# Patient Record
Sex: Female | Born: 1955 | Race: White | Hispanic: No | Smoking: Former smoker
Health system: Southern US, Community
[De-identification: ages and names within clinical notes are randomized; demographics above are authoritative.]

## PROBLEM LIST (undated history)

## (undated) DIAGNOSIS — I10 Essential (primary) hypertension: Secondary | ICD-10-CM

## (undated) DIAGNOSIS — F319 Bipolar disorder, unspecified: Secondary | ICD-10-CM

## (undated) DIAGNOSIS — E079 Disorder of thyroid, unspecified: Secondary | ICD-10-CM

## (undated) DIAGNOSIS — J449 Chronic obstructive pulmonary disease, unspecified: Secondary | ICD-10-CM

## (undated) DIAGNOSIS — I509 Heart failure, unspecified: Secondary | ICD-10-CM

## (undated) DIAGNOSIS — F200 Paranoid schizophrenia: Secondary | ICD-10-CM

## (undated) HISTORY — PX: TOE AMPUTATION: SHX809

## (undated) HISTORY — PX: SHOULDER SURGERY: SHX246

## (undated) HISTORY — PX: ABDOMINAL HYSTERECTOMY: SHX81

---

## 2009-12-15 ENCOUNTER — Emergency Department (HOSPITAL_BASED_OUTPATIENT_CLINIC_OR_DEPARTMENT_OTHER): Admission: EM | Admit: 2009-12-15 | Discharge: 2009-12-15 | Payer: Self-pay | Admitting: Emergency Medicine

## 2009-12-15 ENCOUNTER — Ambulatory Visit: Payer: Self-pay | Admitting: Radiology

## 2009-12-16 ENCOUNTER — Emergency Department (HOSPITAL_BASED_OUTPATIENT_CLINIC_OR_DEPARTMENT_OTHER): Admission: EM | Admit: 2009-12-16 | Discharge: 2009-12-16 | Payer: Self-pay | Admitting: Emergency Medicine

## 2011-01-02 IMAGING — CR DG CHEST 2V
2 series · 2 of 2 positions shown · non-contrast
Comparison: None available.

CLINICAL DATA: Of breath.  COPD.  Cough and fever.

CHEST - 2 VIEW

[w chest pa]
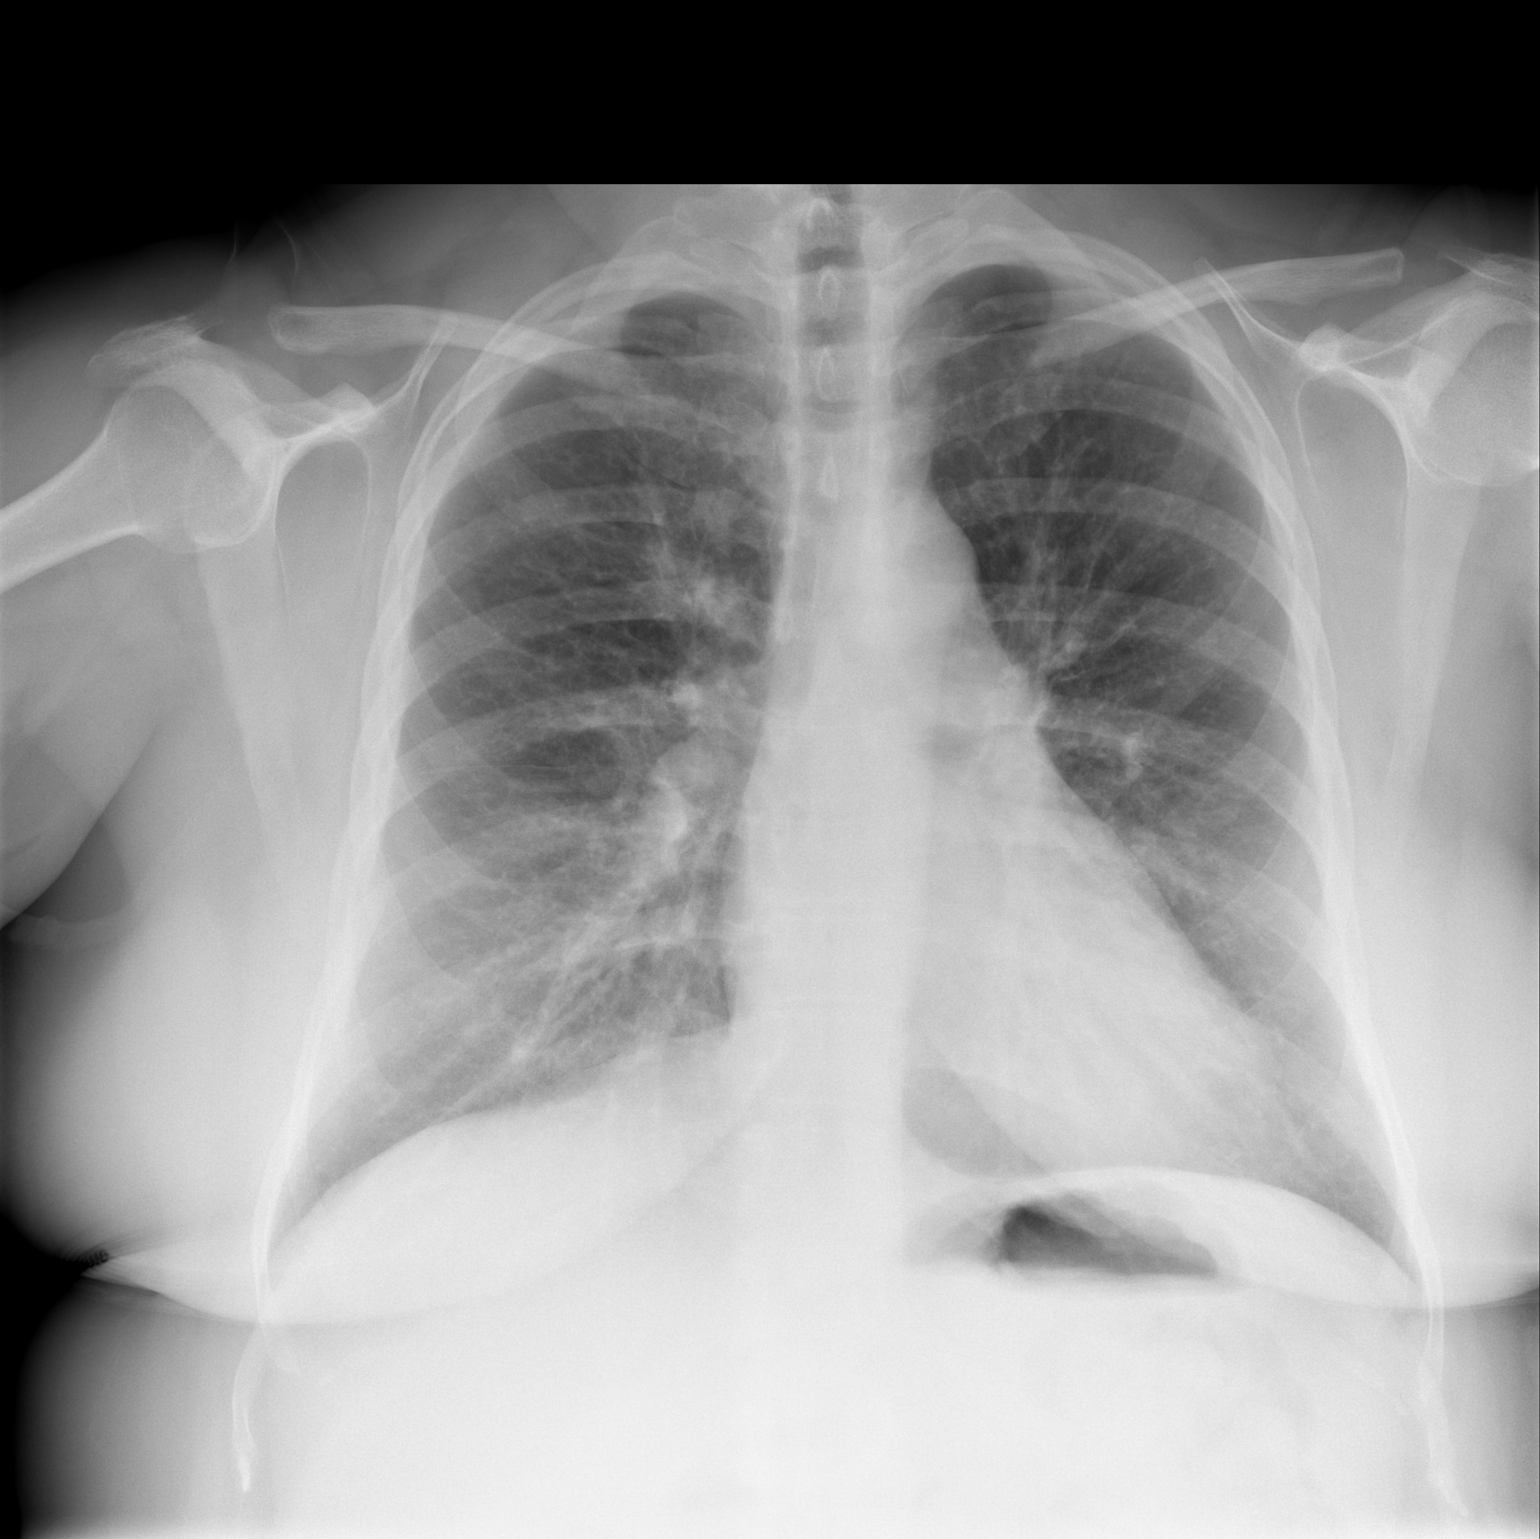

[w chest lat]
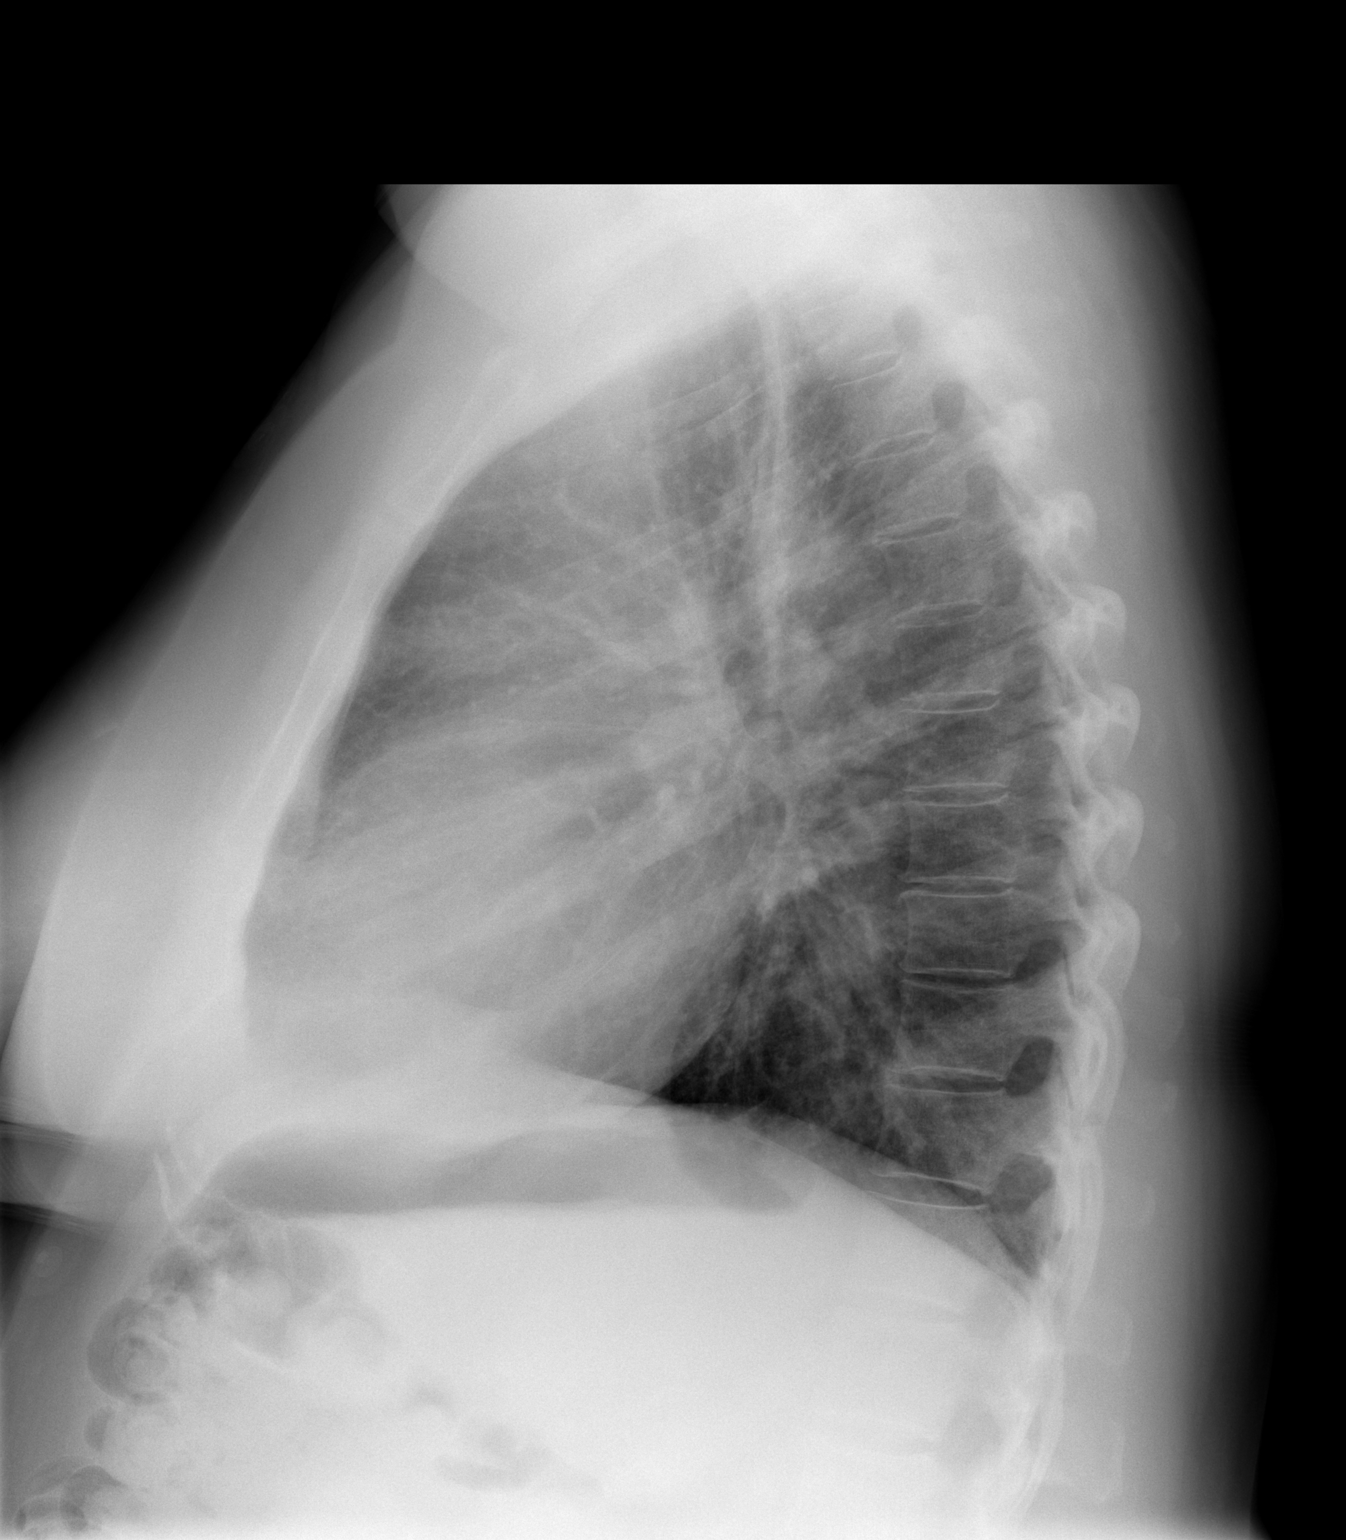

[2 of 2 positions shown; findings below may reference images not displayed]

FINDINGS: Cardiopericardial silhouette is at the upper limits of
normal size.  Ill-defined airspace disease is present at the medial
right lower lobe.  The left lung is clear.  Mild bronchitic changes
are noted bilaterally.  The visualized soft tissues and bony thorax
are unremarkable.
IMPRESSION: 1.  Mild bronchitic changes bilaterally.  This can be seen in the
setting of an acute viral process.
2.  Ill-defined airspace disease of the right lower lobe.  While
this may represent atelectasis, early bronchopneumonia is not
excluded.

## 2011-01-30 LAB — CBC
HCT: 39.3 % (ref 36.0–46.0)
Hemoglobin: 13.4 g/dL (ref 12.0–15.0)
Platelets: 185 10*3/uL (ref 150–400)
RBC: 4.3 MIL/uL (ref 3.87–5.11)
RDW: 14.5 % (ref 11.5–15.5)
WBC: 9.8 10*3/uL (ref 4.0–10.5)

## 2011-01-30 LAB — BASIC METABOLIC PANEL
CO2: 32 mEq/L (ref 19–32)
Calcium: 8.2 mg/dL — ABNORMAL LOW (ref 8.4–10.5)
Chloride: 105 mEq/L (ref 96–112)
Creatinine, Ser: 0.7 mg/dL (ref 0.4–1.2)

## 2011-01-30 LAB — DIFFERENTIAL
Eosinophils Absolute: 0 10*3/uL (ref 0.0–0.7)
Eosinophils Relative: 0 % (ref 0–5)
Monocytes Absolute: 0.7 10*3/uL (ref 0.1–1.0)
Monocytes Relative: 7 % (ref 3–12)
Neutro Abs: 7 10*3/uL (ref 1.7–7.7)
Neutrophils Relative %: 72 % (ref 43–77)

## 2014-05-01 ENCOUNTER — Encounter (HOSPITAL_BASED_OUTPATIENT_CLINIC_OR_DEPARTMENT_OTHER): Payer: Self-pay | Admitting: Emergency Medicine

## 2014-05-01 ENCOUNTER — Emergency Department (HOSPITAL_BASED_OUTPATIENT_CLINIC_OR_DEPARTMENT_OTHER): Payer: Medicare Other

## 2014-05-01 ENCOUNTER — Inpatient Hospital Stay (HOSPITAL_BASED_OUTPATIENT_CLINIC_OR_DEPARTMENT_OTHER)
Admission: EM | Admit: 2014-05-01 | Discharge: 2014-05-03 | DRG: 190 | Disposition: A | Discharge status: Home / Self Care | Payer: Medicare Other | Attending: Internal Medicine | Admitting: Internal Medicine

## 2014-05-01 DIAGNOSIS — Z885 Allergy status to narcotic agent status: Secondary | ICD-10-CM

## 2014-05-01 DIAGNOSIS — E039 Hypothyroidism, unspecified: Secondary | ICD-10-CM | POA: Diagnosis present

## 2014-05-01 DIAGNOSIS — M542 Cervicalgia: Secondary | ICD-10-CM | POA: Diagnosis present

## 2014-05-01 DIAGNOSIS — K219 Gastro-esophageal reflux disease without esophagitis: Secondary | ICD-10-CM | POA: Diagnosis present

## 2014-05-01 DIAGNOSIS — J9601 Acute respiratory failure with hypoxia: Secondary | ICD-10-CM

## 2014-05-01 DIAGNOSIS — F319 Bipolar disorder, unspecified: Secondary | ICD-10-CM | POA: Diagnosis present

## 2014-05-01 DIAGNOSIS — F411 Generalized anxiety disorder: Secondary | ICD-10-CM | POA: Diagnosis present

## 2014-05-01 DIAGNOSIS — J9621 Acute and chronic respiratory failure with hypoxia: Secondary | ICD-10-CM

## 2014-05-01 DIAGNOSIS — I5030 Unspecified diastolic (congestive) heart failure: Secondary | ICD-10-CM | POA: Diagnosis present

## 2014-05-01 DIAGNOSIS — R079 Chest pain, unspecified: Secondary | ICD-10-CM | POA: Diagnosis present

## 2014-05-01 DIAGNOSIS — G894 Chronic pain syndrome: Secondary | ICD-10-CM | POA: Diagnosis present

## 2014-05-01 DIAGNOSIS — F200 Paranoid schizophrenia: Secondary | ICD-10-CM | POA: Diagnosis present

## 2014-05-01 DIAGNOSIS — Z91041 Radiographic dye allergy status: Secondary | ICD-10-CM

## 2014-05-01 DIAGNOSIS — J441 Chronic obstructive pulmonary disease with (acute) exacerbation: Principal | ICD-10-CM | POA: Diagnosis present

## 2014-05-01 DIAGNOSIS — Z9981 Dependence on supplemental oxygen: Secondary | ICD-10-CM

## 2014-05-01 DIAGNOSIS — I509 Heart failure, unspecified: Secondary | ICD-10-CM | POA: Diagnosis present

## 2014-05-01 DIAGNOSIS — I10 Essential (primary) hypertension: Secondary | ICD-10-CM | POA: Diagnosis present

## 2014-05-01 DIAGNOSIS — R0689 Other abnormalities of breathing: Secondary | ICD-10-CM

## 2014-05-01 DIAGNOSIS — F17200 Nicotine dependence, unspecified, uncomplicated: Secondary | ICD-10-CM | POA: Diagnosis present

## 2014-05-01 DIAGNOSIS — J9620 Acute and chronic respiratory failure, unspecified whether with hypoxia or hypercapnia: Secondary | ICD-10-CM | POA: Diagnosis present

## 2014-05-01 HISTORY — DX: Heart failure, unspecified: I50.9

## 2014-05-01 HISTORY — DX: Paranoid schizophrenia: F20.0

## 2014-05-01 HISTORY — DX: Chronic obstructive pulmonary disease, unspecified: J44.9

## 2014-05-01 HISTORY — DX: Disorder of thyroid, unspecified: E07.9

## 2014-05-01 HISTORY — DX: Essential (primary) hypertension: I10

## 2014-05-01 HISTORY — DX: Bipolar disorder, unspecified: F31.9

## 2014-05-01 LAB — CBC WITH DIFFERENTIAL/PLATELET
BASOS ABS: 0 10*3/uL (ref 0.0–0.1)
BASOS PCT: 0 % (ref 0–1)
EOS ABS: 0.2 10*3/uL (ref 0.0–0.7)
Eosinophils Relative: 3 % (ref 0–5)
HCT: 45.9 % (ref 36.0–46.0)
HEMOGLOBIN: 14.4 g/dL (ref 12.0–15.0)
Lymphocytes Relative: 34 % (ref 12–46)
Lymphs Abs: 2.1 10*3/uL (ref 0.7–4.0)
MCH: 30.1 pg (ref 26.0–34.0)
MCHC: 31.4 g/dL (ref 30.0–36.0)
MCV: 96 fL (ref 78.0–100.0)
Monocytes Absolute: 0.6 10*3/uL (ref 0.1–1.0)
Monocytes Relative: 10 % (ref 3–12)
NEUTROS PCT: 54 % (ref 43–77)
Neutro Abs: 3.4 10*3/uL (ref 1.7–7.7)
PLATELETS: 203 10*3/uL (ref 150–400)
RBC: 4.78 MIL/uL (ref 3.87–5.11)
RDW: 15.1 % (ref 11.5–15.5)
WBC: 6.4 10*3/uL (ref 4.0–10.5)

## 2014-05-01 LAB — COMPREHENSIVE METABOLIC PANEL
ALT: 13 U/L (ref 0–35)
AST: 14 U/L (ref 0–37)
Albumin: 3.4 g/dL — ABNORMAL LOW (ref 3.5–5.2)
Alkaline Phosphatase: 87 U/L (ref 39–117)
BUN: 9 mg/dL (ref 6–23)
CO2: 31 mEq/L (ref 19–32)
CREATININE: 1 mg/dL (ref 0.50–1.10)
Calcium: 9.2 mg/dL (ref 8.4–10.5)
Chloride: 98 mEq/L (ref 96–112)
GFR, EST AFRICAN AMERICAN: 71 mL/min — AB (ref >90)
GFR, EST NON AFRICAN AMERICAN: 61 mL/min — AB (ref >90)
GLUCOSE: 127 mg/dL — AB (ref 70–99)
POTASSIUM: 3.5 meq/L — AB (ref 3.7–5.3)
SODIUM: 141 meq/L (ref 137–147)
Total Protein: 6.9 g/dL (ref 6.0–8.3)

## 2014-05-01 LAB — PRO B NATRIURETIC PEPTIDE: PRO B NATRI PEPTIDE: 20.5 pg/mL (ref 0–125)

## 2014-05-01 LAB — TROPONIN I: Troponin I: <0.3 ng/mL (ref <0.30)

## 2014-05-01 MED ORDER — ALBUTEROL SULFATE (2.5 MG/3ML) 0.083% IN NEBU
2.5000 mg | INHALATION_SOLUTION | Freq: Once | RESPIRATORY_TRACT | Status: AC
  Administered 2014-05-01: 2.5 mg via RESPIRATORY_TRACT

## 2014-05-01 MED ORDER — IPRATROPIUM BROMIDE 0.02 % IN SOLN
0.5000 mg | Freq: Once | RESPIRATORY_TRACT | Status: DC
  Filled 2014-05-01: qty 2.5

## 2014-05-01 MED ORDER — IPRATROPIUM-ALBUTEROL 0.5-2.5 (3) MG/3ML IN SOLN
3.0000 mL | RESPIRATORY_TRACT | Status: DC
  Administered 2014-05-01 – 2014-05-03 (×7): 3 mL via RESPIRATORY_TRACT
  Filled 2014-05-01 (×8): qty 3

## 2014-05-01 MED ORDER — ALBUTEROL SULFATE (2.5 MG/3ML) 0.083% IN NEBU
5.0000 mg | INHALATION_SOLUTION | Freq: Once | RESPIRATORY_TRACT | Status: DC
  Filled 2014-05-01: qty 6

## 2014-05-01 MED ORDER — ALBUTEROL (5 MG/ML) CONTINUOUS INHALATION SOLN
10.0000 mg/h | INHALATION_SOLUTION | Freq: Once | RESPIRATORY_TRACT | Status: AC
  Administered 2014-05-01: 10 mg/h via RESPIRATORY_TRACT
  Filled 2014-05-01: qty 20

## 2014-05-01 MED ORDER — METHYLPREDNISOLONE SODIUM SUCC 125 MG IJ SOLR
125.0000 mg | Freq: Once | INTRAMUSCULAR | Status: AC
  Administered 2014-05-02: 125 mg via INTRAVENOUS
  Filled 2014-05-01: qty 2

## 2014-05-01 NOTE — ED Provider Notes (Signed)
CSN: [id number]     Arrival date & time 05/01/14  2144 History   First MD Initiated Contact with Patient 05/01/14 2213     Chief Complaint  Patient presents with  . Shortness of Breath     (Consider location/radiation/quality/duration/timing/severity/associated sxs/prior Treatment) Patient is a 58 y.o. female presenting with shortness of breath. The history is provided by the patient and the nursing home. No language interpreter was used.  Shortness of Breath Severity:  Moderate Onset quality:  Gradual Associated symptoms: chest pain, cough, fever, neck pain and vomiting   Associated symptoms: no abdominal pain   Associated symptoms comment:  She started having SOB earlier this afternoon, later associated with chest pressure "like an elephant sitting on my chest". She reports an increased cough with post-tussive vomiting earlier today. She reports fever to 103 orally 3 days ago but none since. She has a history of COPD with continuous tobacco use, home oxygen dependence, CHF on 80 mg Lasix daily with a reported decreased efficacy in reducing peripheral edema. She also reports a "knot in her throat" that started today causing both painful swallowing and pain with movement of her neck. She also has a concern for redness of her lower extremities bilaterally with a recent stated history of cellulitis, and concern for recurrence given the previous fever.    Past Medical History  Diagnosis Date  . COPD (chronic obstructive pulmonary disease)   . CHF (congestive heart failure)   . Thyroid disease   . Hypertension   . Bipolar 1 disorder   . Paranoid schizophrenia    Past Surgical History  Procedure Laterality Date  . Abdominal hysterectomy    . Shoulder surgery     No family history on file. History  Substance Use Topics  . Smoking status: Current Every Day Smoker -- 1.50 packs/day    Types: Cigarettes  . Smokeless tobacco: Not on file  . Alcohol Use: No   OB History   Grav Para  Term Preterm Abortions TAB SAB Ect Mult Living                 Review of Systems  Constitutional: Positive for fever.       See HPI.  Respiratory: Positive for cough and shortness of breath.   Cardiovascular: Positive for chest pain and leg swelling.  Gastrointestinal: Positive for vomiting. Negative for nausea and abdominal pain.       See HPI - post-tussive vomiting only.  Musculoskeletal: Positive for neck pain.      Allergies  Ivp dye; Morphine and related; and Vioxx  Home Medications   Prior to Admission medications   Medication Sig Start Date End Date Taking? Authorizing Regis Hinton  ALBUTEROL IN Inhale into the lungs.   Yes Historical Merrissa Giacobbe, MD  ALPRAZolam Prudy Feeler(XANAX) 1 MG tablet Take 1 mg by mouth at bedtime as needed for anxiety.   Yes Historical Mikki Ziff, MD  ARIPiprazole (ABILIFY PO) Take by mouth.   Yes Historical Jakwan Sally, MD  Carisoprodol (SOMA PO) Take by mouth.   Yes Historical Eustacio Ellen, MD  Citalopram Hydrobromide (CELEXA PO) Take by mouth.   Yes Historical Zykera Abella, MD  Fluticasone-Salmeterol (ADVAIR DISKUS IN) Inhale into the lungs.   Yes Historical Wanita Derenzo, MD  Furosemide (LASIX PO) Take by mouth.   Yes Historical Korby Ratay, MD  Levothyroxine Sodium (SYNTHROID PO) Take by mouth.   Yes Historical Terrace Chiem, MD  LOSARTAN POTASSIUM PO Take by mouth.   Yes Historical Dalasia Predmore, MD  Omeprazole (PRILOSEC PO) Take by mouth.  Yes Historical Lamarco Gudiel, MD  OxyCODONE HCl (OXYCONTIN PO) Take by mouth.   Yes Historical Oma Alpert, MD  oxyCODONE-acetaminophen (PERCOCET) 10-325 MG per tablet Take 1 tablet by mouth every 4 (four) hours as needed for pain.   Yes Historical Kiyanna Biegler, MD  Potassium (POTASSIMIN PO) Take by mouth.   Yes Historical Karee Christopherson, MD  tiotropium (SPIRIVA) 18 MCG inhalation capsule Place 18 mcg into inhaler and inhale daily.   Yes Historical Kelleen Stolze, MD   BP 127/72  Pulse 92  Temp(Src) 98 F (36.7 C) (Oral)  Resp 20  Ht 5\' 7"  (1.702 m)  Wt 240 lb  (108.863 kg)  BMI 37.58 kg/m2  SpO2 88% Physical Exam  Constitutional: She is oriented to person, place, and time. She appears well-developed and well-nourished. No distress.  HENT:  Head: Normocephalic.  Mouth/Throat: Oropharynx is clear and moist.  Neck: Normal range of motion.  Neck held in right lateral position but she is able to perform full range of motion.  Pulmonary/Chest: Effort normal.  Marked inspiratory and expiratory wheezing and coarse rhonchi.   Abdominal: Soft. There is no tenderness.  Musculoskeletal: She exhibits edema.  Bilateral lower extremity edema with redness. No warm. Minimally tender. Large venous stasis blister left anterior lower extremity.  Lymphadenopathy:    She has cervical adenopathy.  Neurological: She is alert and oriented to person, place, and time.  Skin: Skin is warm and dry.  Psychiatric: She has a normal mood and affect.    ED Course  Procedures (including critical care time) Labs Review Labs Reviewed  CBC WITH DIFFERENTIAL  COMPREHENSIVE METABOLIC PANEL  TROPONIN I  PRO B NATRIURETIC PEPTIDE    Imaging Review Dg Chest 2 View  05/01/2014   CLINICAL DATA:  Cough with shortness of breath. Chest heaviness. Home oxygen.  EXAM: CHEST  2 VIEW  COMPARISON:  12/15/2009  FINDINGS: Borderline heart size without vascular congestion. Central peribronchial thickening with linear infiltration in the right lung base probably representing changes of bronchitis or airways disease. No focal consolidation or airspace disease noted.  IMPRESSION: Peribronchial thickening and linear streaky opacities suggesting bronchitis or airways disease. No focal consolidation.   Electronically Signed   By: Burman NievesWilliam  Stevens M.D.   On: 05/01/2014 22:48     EKG Interpretation   Date/Time:  Monday May 01 2014 22:19:12 EDT Ventricular Rate:  79 PR Interval:  148 QRS Duration: 94 QT Interval:  404 QTC Calculation: 463 R Axis:   -80 Text Interpretation:  Normal sinus  rhythm Left axis deviation Cannot rule  out Anterior infarct , age undetermined Abnormal ECG No significant change  since last tracing Confirmed by YAO  MD, DAVID ([id number]) on 05/01/2014  10:27:03 PM      MDM   Final diagnoses:  None    1. COPD Exacerbation  CXR showing no edema and BNP essentially negative. Does not appear to be CHF. Troponin and EKG without evidence for acute coronary event. Suspect with wheezing and hypoxia that current condition is COPD exacerbation. Steroids given. Multiple breathing nebulizers, including an hour long treatment. Patient will desaturate into the upper 80's while at rest. Discussed with [facility], Dr. Allena KatzPatel, who accepts [facility]. Abx started as recommended by admitting MD.     Arnoldo HookerShari A Upstill, PA-C 05/02/14 2035

## 2014-05-01 NOTE — ED Notes (Addendum)
Sob today. States she has developed a bad cough. Sputum is yellow. Uses home oxygen 2.5 l/m [place] at night. Oxygen sats noted. Pt is speaking in complete sentences and does not appear to be in resp distress. She states at home today her sats have been 87% all day.

## 2014-05-02 DIAGNOSIS — M79609 Pain in unspecified limb: Secondary | ICD-10-CM

## 2014-05-02 DIAGNOSIS — R0989 Other specified symptoms and signs involving the circulatory and respiratory systems: Secondary | ICD-10-CM

## 2014-05-02 DIAGNOSIS — E039 Hypothyroidism, unspecified: Secondary | ICD-10-CM | POA: Diagnosis present

## 2014-05-02 DIAGNOSIS — F411 Generalized anxiety disorder: Secondary | ICD-10-CM | POA: Diagnosis present

## 2014-05-02 DIAGNOSIS — I10 Essential (primary) hypertension: Secondary | ICD-10-CM

## 2014-05-02 DIAGNOSIS — R0689 Other abnormalities of breathing: Secondary | ICD-10-CM | POA: Diagnosis present

## 2014-05-02 DIAGNOSIS — J9600 Acute respiratory failure, unspecified whether with hypoxia or hypercapnia: Secondary | ICD-10-CM

## 2014-05-02 DIAGNOSIS — G894 Chronic pain syndrome: Secondary | ICD-10-CM

## 2014-05-02 DIAGNOSIS — R0609 Other forms of dyspnea: Secondary | ICD-10-CM

## 2014-05-02 DIAGNOSIS — Z9981 Dependence on supplemental oxygen: Secondary | ICD-10-CM

## 2014-05-02 DIAGNOSIS — J441 Chronic obstructive pulmonary disease with (acute) exacerbation: Secondary | ICD-10-CM | POA: Diagnosis present

## 2014-05-02 DIAGNOSIS — M7989 Other specified soft tissue disorders: Secondary | ICD-10-CM

## 2014-05-02 DIAGNOSIS — R072 Precordial pain: Secondary | ICD-10-CM

## 2014-05-02 DIAGNOSIS — I509 Heart failure, unspecified: Secondary | ICD-10-CM

## 2014-05-02 LAB — COMPREHENSIVE METABOLIC PANEL
ALT: 11 U/L (ref 0–35)
AST: 11 U/L (ref 0–37)
Albumin: 3.1 g/dL — ABNORMAL LOW (ref 3.5–5.2)
Alkaline Phosphatase: 86 U/L (ref 39–117)
BUN: 10 mg/dL (ref 6–23)
CO2: 30 mEq/L (ref 19–32)
Calcium: 8.7 mg/dL (ref 8.4–10.5)
Chloride: 97 mEq/L (ref 96–112)
Creatinine, Ser: 0.91 mg/dL (ref 0.50–1.10)
GFR calc Af Amer: 80 mL/min — ABNORMAL LOW (ref >90)
GFR calc non Af Amer: 69 mL/min — ABNORMAL LOW (ref >90)
Glucose, Bld: 210 mg/dL — ABNORMAL HIGH (ref 70–99)
Potassium: 3.4 mEq/L — ABNORMAL LOW (ref 3.7–5.3)
Sodium: 141 mEq/L (ref 137–147)
Total Bilirubin: <0.2 mg/dL — ABNORMAL LOW (ref 0.3–1.2)
Total Protein: 6.2 g/dL (ref 6.0–8.3)

## 2014-05-02 LAB — I-STAT ARTERIAL BLOOD GAS, ED
Acid-Base Excess: 6 mmol/L — ABNORMAL HIGH (ref 0.0–2.0)
BICARBONATE: 35.5 meq/L — AB (ref 20.0–24.0)
O2 SAT: 85 %
PCO2 ART: 69 mmHg — AB (ref 35.0–45.0)
PO2 ART: 55 mmHg — AB (ref 80.0–100.0)
Patient temperature: 98
TCO2: 38 mmol/L (ref 0–100)
pH, Arterial: 7.318 — ABNORMAL LOW (ref 7.350–7.450)

## 2014-05-02 LAB — GLUCOSE, CAPILLARY
Glucose-Capillary: 144 mg/dL — ABNORMAL HIGH (ref 70–99)
Glucose-Capillary: 161 mg/dL — ABNORMAL HIGH (ref 70–99)

## 2014-05-02 LAB — STREP PNEUMONIAE URINARY ANTIGEN: Strep Pneumo Urinary Antigen: NEGATIVE

## 2014-05-02 LAB — CBC
HCT: 42.9 % (ref 36.0–46.0)
HEMOGLOBIN: 13 g/dL (ref 12.0–15.0)
MCH: 29.4 pg (ref 26.0–34.0)
MCHC: 30.3 g/dL (ref 30.0–36.0)
MCV: 97.1 fL (ref 78.0–100.0)
PLATELETS: 189 10*3/uL (ref 150–400)
RBC: 4.42 MIL/uL (ref 3.87–5.11)
RDW: 15.3 % (ref 11.5–15.5)
WBC: 5.1 10*3/uL (ref 4.0–10.5)

## 2014-05-02 LAB — TROPONIN I
Troponin I: <0.3 ng/mL (ref <0.30)
Troponin I: <0.3 ng/mL (ref <0.30)

## 2014-05-02 LAB — PROTIME-INR
INR: 0.98 (ref 0.00–1.49)
Prothrombin Time: 12.8 seconds (ref 11.6–15.2)

## 2014-05-02 LAB — LEGIONELLA ANTIGEN, URINE: Legionella Antigen, Urine: NEGATIVE

## 2014-05-02 MED ORDER — ENOXAPARIN SODIUM 60 MG/0.6ML ~~LOC~~ SOLN
55.0000 mg | SUBCUTANEOUS | Status: DC
  Administered 2014-05-02 – 2014-05-03 (×2): 55 mg via SUBCUTANEOUS
  Filled 2014-05-02 (×3): qty 0.6

## 2014-05-02 MED ORDER — ACETAMINOPHEN 325 MG PO TABS
650.0000 mg | ORAL_TABLET | Freq: Four times a day (QID) | ORAL | Status: DC | PRN
  Administered 2014-05-02: 650 mg via ORAL
  Filled 2014-05-02: qty 2

## 2014-05-02 MED ORDER — DEXTROSE 5 % IV SOLN
500.0000 mg | Freq: Once | INTRAVENOUS | Status: AC
  Administered 2014-05-02: 500 mg via INTRAVENOUS
  Filled 2014-05-02: qty 500

## 2014-05-02 MED ORDER — FUROSEMIDE 40 MG PO TABS
40.0000 mg | ORAL_TABLET | Freq: Two times a day (BID) | ORAL | Status: DC
  Administered 2014-05-02 – 2014-05-03 (×3): 40 mg via ORAL
  Filled 2014-05-02 (×5): qty 1

## 2014-05-02 MED ORDER — OXYCODONE-ACETAMINOPHEN 5-325 MG PO TABS
1.0000 | ORAL_TABLET | Freq: Four times a day (QID) | ORAL | Status: DC | PRN
  Administered 2014-05-02 – 2014-05-03 (×2): 1 via ORAL
  Filled 2014-05-02 (×2): qty 1

## 2014-05-02 MED ORDER — BIOTENE DRY MOUTH MT LIQD
15.0000 mL | Freq: Two times a day (BID) | OROMUCOSAL | Status: DC
  Administered 2014-05-02 – 2014-05-03 (×3): 15 mL via OROMUCOSAL

## 2014-05-02 MED ORDER — CYCLOBENZAPRINE HCL 10 MG PO TABS
5.0000 mg | ORAL_TABLET | Freq: Three times a day (TID) | ORAL | Status: DC | PRN
  Administered 2014-05-02: 5 mg via ORAL
  Filled 2014-05-02: qty 1

## 2014-05-02 MED ORDER — GUAIFENESIN ER 600 MG PO TB12
600.0000 mg | ORAL_TABLET | Freq: Two times a day (BID) | ORAL | Status: DC
  Administered 2014-05-02 – 2014-05-03 (×3): 600 mg via ORAL
  Filled 2014-05-02 (×4): qty 1

## 2014-05-02 MED ORDER — PANTOPRAZOLE SODIUM 40 MG PO TBEC
40.0000 mg | DELAYED_RELEASE_TABLET | Freq: Every day | ORAL | Status: DC
  Administered 2014-05-02 – 2014-05-03 (×2): 40 mg via ORAL
  Filled 2014-05-02 (×2): qty 1

## 2014-05-02 MED ORDER — ONDANSETRON HCL 4 MG/2ML IJ SOLN: 4.0000 mg | Freq: Four times a day (QID) | INTRAMUSCULAR | Status: DC | PRN

## 2014-05-02 MED ORDER — POTASSIUM CHLORIDE CRYS ER 20 MEQ PO TBCR
40.0000 meq | EXTENDED_RELEASE_TABLET | Freq: Two times a day (BID) | ORAL | Status: DC
  Administered 2014-05-02 – 2014-05-03 (×3): 40 meq via ORAL
  Filled 2014-05-02 (×4): qty 2

## 2014-05-02 MED ORDER — INSULIN ASPART 100 UNIT/ML ~~LOC~~ SOLN
0.0000 [IU] | Freq: Three times a day (TID) | SUBCUTANEOUS | Status: DC
  Administered 2014-05-02 – 2014-05-03 (×3): 2 [IU] via SUBCUTANEOUS

## 2014-05-02 MED ORDER — LIDOCAINE 5 % EX PTCH
1.0000 | MEDICATED_PATCH | CUTANEOUS | Status: DC
  Administered 2014-05-02 – 2014-05-03 (×2): 1 via TRANSDERMAL
  Filled 2014-05-02 (×3): qty 1

## 2014-05-02 MED ORDER — DEXTROSE 5 % IV SOLN
500.0000 mg | INTRAVENOUS | Status: DC
  Administered 2014-05-03: 500 mg via INTRAVENOUS
  Filled 2014-05-02 (×3): qty 500

## 2014-05-02 MED ORDER — DEXTROSE 5 % IV SOLN
1.0000 g | INTRAVENOUS | Status: DC
  Administered 2014-05-03: 1 g via INTRAVENOUS
  Filled 2014-05-02 (×3): qty 10

## 2014-05-02 MED ORDER — METHYLPREDNISOLONE SODIUM SUCC 125 MG IJ SOLR
60.0000 mg | Freq: Two times a day (BID) | INTRAMUSCULAR | Status: DC
  Filled 2014-05-02: qty 0.96

## 2014-05-02 MED ORDER — METHYLPREDNISOLONE SODIUM SUCC 125 MG IJ SOLR
60.0000 mg | Freq: Four times a day (QID) | INTRAMUSCULAR | Status: DC
  Administered 2014-05-02 – 2014-05-03 (×5): 60 mg via INTRAVENOUS
  Filled 2014-05-02 (×9): qty 0.96

## 2014-05-02 MED ORDER — ALPRAZOLAM 0.5 MG PO TABS
1.0000 mg | ORAL_TABLET | Freq: Once | ORAL | Status: AC
  Administered 2014-05-02: 0.5 mg via ORAL
  Filled 2014-05-02: qty 2

## 2014-05-02 MED ORDER — CHLORHEXIDINE GLUCONATE 0.12 % MT SOLN
15.0000 mL | Freq: Two times a day (BID) | OROMUCOSAL | Status: DC
  Administered 2014-05-02 – 2014-05-03 (×3): 15 mL via OROMUCOSAL
  Filled 2014-05-02 (×5): qty 15

## 2014-05-02 MED ORDER — LEVOTHYROXINE SODIUM 150 MCG PO TABS
150.0000 ug | ORAL_TABLET | Freq: Every day | ORAL | Status: DC
  Administered 2014-05-02 – 2014-05-03 (×2): 150 ug via ORAL
  Filled 2014-05-02 (×3): qty 1

## 2014-05-02 MED ORDER — ACETAMINOPHEN 650 MG RE SUPP: 650.0000 mg | Freq: Four times a day (QID) | RECTAL | Status: DC | PRN

## 2014-05-02 MED ORDER — DEXTROSE 5 % IV SOLN
1.0000 g | Freq: Once | INTRAVENOUS | Status: AC
  Administered 2014-05-02: 1 g via INTRAVENOUS

## 2014-05-02 MED ORDER — IPRATROPIUM-ALBUTEROL 0.5-2.5 (3) MG/3ML IN SOLN
3.0000 mL | RESPIRATORY_TRACT | Status: DC
  Administered 2014-05-02: 3 mL via RESPIRATORY_TRACT

## 2014-05-02 MED ORDER — ONDANSETRON HCL 4 MG PO TABS: 4.0000 mg | ORAL_TABLET | Freq: Four times a day (QID) | ORAL | Status: DC | PRN

## 2014-05-02 MED ORDER — SODIUM CHLORIDE 0.9 % IJ SOLN
3.0000 mL | Freq: Two times a day (BID) | INTRAMUSCULAR | Status: DC
  Administered 2014-05-02 – 2014-05-03 (×4): 3 mL via INTRAVENOUS

## 2014-05-02 MED ORDER — CEFTRIAXONE SODIUM 1 G IJ SOLR
INTRAMUSCULAR | Status: AC
  Filled 2014-05-02: qty 10

## 2014-05-02 MED ORDER — ENOXAPARIN SODIUM 40 MG/0.4ML ~~LOC~~ SOLN: 40.0000 mg | SUBCUTANEOUS | Status: DC

## 2014-05-02 MED ORDER — BUDESONIDE 0.25 MG/2ML IN SUSP
0.2500 mg | Freq: Two times a day (BID) | RESPIRATORY_TRACT | Status: DC
  Administered 2014-05-02 – 2014-05-03 (×3): 0.25 mg via RESPIRATORY_TRACT
  Filled 2014-05-02 (×5): qty 2

## 2014-05-02 MED ORDER — PHENAZOPYRIDINE HCL 100 MG PO TABS
100.0000 mg | ORAL_TABLET | Freq: Three times a day (TID) | ORAL | Status: DC
  Administered 2014-05-02 – 2014-05-03 (×5): 100 mg via ORAL
  Filled 2014-05-02 (×7): qty 1

## 2014-05-02 MED ORDER — FUROSEMIDE 10 MG/ML IJ SOLN
40.0000 mg | Freq: Once | INTRAMUSCULAR | Status: AC
  Administered 2014-05-02: 40 mg via INTRAVENOUS

## 2014-05-02 NOTE — Progress Notes (Signed)
Pt potassium 3.4 and 40mg  IV lasix due. MD on call paged

## 2014-05-02 NOTE — Progress Notes (Signed)
Patient seen and examined. Admitted after midnight by Dr. Allena KatzPatel due to increased SOB. Presentation appears to be due to exacerbation of COPD. Patient also reports some CP, associated with increased coughing. Most likely musculoskeletal vs associated with COPD vs GERD.  Please referred to H&P dictated by Dr. Allena KatzPatel for further info/details on admission and assessment/plan.  Recommendations: -so far no abnormal troponin and EKG/telemetry not revealing signs of acute ischemia -will add pulmicort, flutter valve and PPI to current regimen -BNP WNL, no vascular congestion on CXR and no JVD or overt swelling; given this findings doubt any component of CHF contributing to her SOB. Will resume home PO lasix dose, continue daily weights, strict intake and output and follow low sodium diet -will follow clinical response -will follow a replete electrolytes as needed  Gabrielle LollMadera, Gabrielle Richardson [phone]

## 2014-05-02 NOTE — Progress Notes (Signed)
Pt requesting xanax, however she is drowsy.  MD notified, will continue to monitor.

## 2014-05-02 NOTE — Progress Notes (Signed)
Pt complaining of pain with coughing, requesting home pain medicine oxycontin and oxycodone. Pt states she feels drowsy, arouses to voice. Pt still on venti-mask at 50% O2 sat 92-93%. K Schorr notified. When re-assessed with MD on phone, pt sleeping. No new orders at this time. Will continue to monitor.

## 2014-05-02 NOTE — Progress Notes (Signed)
UR complete.  Jakirah Zaun RN, MSN 

## 2014-05-02 NOTE — Progress Notes (Signed)
Inpatient Diabetes Program Recommendations  AACE/ADA: New Consensus Statement on Inpatient Glycemic Control (2013)  Target Ranges:  Prepandial:   less than 140 mg/dL      Peak postprandial:   less than 180 mg/dL (1-2 hours)      Critically ill patients:  140 - 180 mg/dL   Lab glucose elevated 210 this morning.  Recommend monitoring CBGs achs and start Novolog correction scale if needed. Thank you  Piedad ClimesGina Davis BSN, RN,CDE Inpatient Diabetes Coordinator [phone] (team pager)

## 2014-05-02 NOTE — H&P (Signed)
[facility] History and Physical  Patient: Gabrielle Richardson  JYN:[id number]  DOB: 12/30/1955  DOS: the patient was seen and examined on 05/02/2014 PCP: No primary provider on file.  Chief Complaint: Shortness of breath  HPI: Gabrielle AuerDeborah Schreiter is a 58 y.o. female with Past medical history of COPD, CHF, hypothyroidism, hypertension, bipolar disorder. Patient presents with complaints of cough and shortness of breath. She mentions that this has been ongoing since last one week and was worse tonight. She started having yellowish expectoration. She started having shortness of breath even at rest. She hasn't used. Complaints of nausea and episode of vomiting no episode of choking. No chest pain no palpitation. No abdominal pain no diarrhea or constipation. She complains of burning urination. She complains of bilateral leg swelling. She complains of bilateral leg redness the chest he noted today. She was recently admitted at [facility] [place] and was on life support as per the patient. She is not on prednisone at present.  The patient is coming from home. And at her baseline independent for most of her ADL.  Review of Systems: as mentioned in the history of present illness.  A Comprehensive review of the other systems is negative.  Past Medical History  Diagnosis Date  . COPD (chronic obstructive pulmonary disease)   . CHF (congestive heart failure)   . Thyroid disease   . Hypertension   . Bipolar 1 disorder   . Paranoid schizophrenia    Past Surgical History  Procedure Laterality Date  . Abdominal hysterectomy    . Shoulder surgery     Social History:  reports that she has been smoking Cigarettes.  She has been smoking about 1.50 packs per day. She does not have any smokeless tobacco history on file. She reports that she does not drink alcohol or use illicit drugs.  Allergies  Allergen Reactions  . Ivp Dye [Iodinated Diagnostic Agents]     itching  . Morphine  And Related     rash  . Vioxx [Rofecoxib]     itching    No family history on file.  Prior to Admission medications   Medication Sig Start Date End Date Taking? Authorizing Provider  ALBUTEROL IN Inhale into the lungs.   Yes Historical Provider, MD  ALPRAZolam Prudy Feeler(XANAX) 1 MG tablet Take 1 mg by mouth at bedtime as needed for anxiety.   Yes Historical Provider, MD  ARIPiprazole (ABILIFY PO) Take by mouth.   Yes Historical Provider, MD  Carisoprodol (SOMA PO) Take by mouth.   Yes Historical Provider, MD  Citalopram Hydrobromide (CELEXA PO) Take by mouth.   Yes Historical Provider, MD  Fluticasone-Salmeterol (ADVAIR DISKUS IN) Inhale into the lungs.   Yes Historical Provider, MD  Furosemide (LASIX PO) Take by mouth.   Yes Historical Provider, MD  Levothyroxine Sodium (SYNTHROID PO) Take by mouth.   Yes Historical Provider, MD  LOSARTAN POTASSIUM PO Take by mouth.   Yes Historical Provider, MD  Omeprazole (PRILOSEC PO) Take by mouth.   Yes Historical Provider, MD  OxyCODONE HCl (OXYCONTIN PO) Take by mouth.   Yes Historical Provider, MD  oxyCODONE-acetaminophen (PERCOCET) 10-325 MG per tablet Take 1 tablet by mouth every 4 (four) hours as needed for pain.   Yes Historical Provider, MD  Potassium (POTASSIMIN PO) Take by mouth.   Yes Historical Provider, MD  tiotropium (SPIRIVA) 18 MCG inhalation capsule Place 18 mcg into inhaler and inhale daily.   Yes Historical Provider, MD    Physical Exam: Ceasar MonsFiled  Vitals:   05/02/14 0258 05/02/14 0302 05/02/14 0314 05/02/14 0317  BP: 126/61     Pulse: 86     Temp: 97.8 F (36.6 C)     TempSrc: Axillary     Resp: 22     Height:      Weight: 114.8 kg (253 lb 1.4 oz)     SpO2: 98% 92% 91% 88%    General: Alert, Awake and Oriented to Time, Place and Person. Appear in mild distress Eyes: PERRL ENT: Oral Mucosa clear moist. Neck: no JVD Cardiovascular: S1 and S2 Present, no Murmur, Peripheral Pulses Present Respiratory: Bilateral Air entry equal and  Decreased, no Crackles, bilateral extensive expiratory wheezes Abdomen: Bowel Sound Present, Soft and Non tender Skin: no Rash Extremities: Bilateral redness and Pedal edema, no calf tenderness Neurologic: Grossly no focal neuro deficit.  Labs on Admission:  CBC:  Recent Labs Lab 05/01/14 2228  WBC 6.4  NEUTROABS 3.4  HGB 14.4  HCT 45.9  MCV 96.0  PLT 203    CMP     Component Value Date/Time   NA 141 05/01/2014 2228   K 3.5* 05/01/2014 2228   CL 98 05/01/2014 2228   CO2 31 05/01/2014 2228   GLUCOSE 127* 05/01/2014 2228   BUN 9 05/01/2014 2228   CREATININE 1.00 05/01/2014 2228   CALCIUM 9.2 05/01/2014 2228   PROT 6.9 05/01/2014 2228   ALBUMIN 3.4* 05/01/2014 2228   AST 14 05/01/2014 2228   ALT 13 05/01/2014 2228   ALKPHOS 87 05/01/2014 2228   BILITOT <0.2* 05/01/2014 2228   GFRNONAA 61* 05/01/2014 2228   GFRAA 71* 05/01/2014 2228    No results found for this basename: LIPASE, AMYLASE,  in the last 168 hours No results found for this basename: AMMONIA,  in the last 168 hours   Recent Labs Lab 05/01/14 2228  TROPONINI <0.30   BNP (last 3 results)  Recent Labs  05/01/14 2228  PROBNP 20.5    Radiological Exams on Admission: Dg Chest 2 View  05/01/2014   CLINICAL DATA:  Cough with shortness of breath. Chest heaviness. Home oxygen.  EXAM: CHEST  2 VIEW  COMPARISON:  12/15/2009  FINDINGS: Borderline heart size without vascular congestion. Central peribronchial thickening with linear infiltration in the right lung base probably representing changes of bronchitis or airways disease. No focal consolidation or airspace disease noted.  IMPRESSION: Peribronchial thickening and linear streaky opacities suggesting bronchitis or airways disease. No focal consolidation.   Electronically Signed   By: Burman NievesWilliam  Stevens M.D.   On: 05/01/2014 22:48   EKG: Independently reviewed. normal sinus rhythm, nonspecific ST and T waves changes.  Assessment/Plan Principal Problem:   COPD  exacerbation Active Problems:   Unspecified hypothyroidism   CHF (congestive heart failure)   Oxygen dependent   Anxiety state, unspecified   Chronic pain syndrome   Essential hypertension, benign   Hypercarbia   1. COPD exacerbation Patient presents with with complaints of shortness of breath and cough. Her chest x-ray shows possible bronchial thickening. Without any consideration. She has hypercarbia but appears to be chronic and compensated. At present I had not able to her get [facility]. Currently I would treat her with Ventimask oxygen as needed, Solu-Medrol every 6 hours, DuoNeb, IV ceftriaxone and azithromycin, we'll follow sputum cultures urine antigens.  2. History of CHF At present it is unclear whether patient has history of systolic or diastolic CHF more likely diastolic CHF. I will give her one  dose of IV Lasix and continue her on IV Lasix later on depending on her urine output. At present patient mentions she is unable to urinate and has a 300 cc of urine in her bladder we will straight catheter once if she requires a straight cath again she may require Foley catheter placement.  3. Hypertension Patient is unable to recall her dose of losartan. At present patient stable would continue to monitor.  4. Anxiety GERD Continue Xanax  5. Neck pain, chest pain For neck pain we used lidocaine. For chest pain we will rule out ACS.  DVT Prophylaxis: subcutaneous Heparin Nutrition: Cardiac diet  Code Status: Full  Disposition: Admitted to inpatient in telemetry unit.  Author: Lynden Oxford, MD [facility] Pager: [phone] 05/02/2014, 3:18 AM    If 7PM-7AM, please contact night-coverage [email or web address] Password [email or web address]

## 2014-05-02 NOTE — Progress Notes (Signed)
Per MD pt was placed on 55% Venturi mask because SPO2 was 86% on 5 LPM [place]. Pt is now 93% on 55%.

## 2014-05-02 NOTE — Progress Notes (Signed)
Pt requesting Xanax and oxycodone. States she is "jumpy" because she has not had this medicine in a couple days. Pt states she is having knee pain because she needs knee replacements. Pt requests that RN page MD. Pt alert and talking on phone as RN left room. MD paged will continue to monitor Huel Coventryosenberger, Meredith A, RN

## 2014-05-02 NOTE — Progress Notes (Signed)
Pt I/O cath per order. 450cc urine output. Pt with symptom relief.

## 2014-05-02 NOTE — Progress Notes (Signed)
Pt arrived to floor on continuous neb via [facility]. RT notified. Pt placed on 4Lnc O2 sat between 88-94. Pt with wheezes bilaterally. Pt states she is having pain and unable to void. Bladder scan with 317ml. Pt pCO2 69. Pt placed on venti-mask per RT. Dr. Allena KatzPatel in room and notified. New orders. Will continue to monitor. Huel Coventryosenberger, Meredith A, RN

## 2014-05-02 NOTE — Progress Notes (Signed)
  Echocardiogram 2D Echocardiogram has been performed.  Gabrielle Richardson, [name] 05/02/2014, 3:04 PM

## 2014-05-03 DIAGNOSIS — J9620 Acute and chronic respiratory failure, unspecified whether with hypoxia or hypercapnia: Secondary | ICD-10-CM

## 2014-05-03 DIAGNOSIS — R0902 Hypoxemia: Secondary | ICD-10-CM

## 2014-05-03 LAB — GLUCOSE, CAPILLARY
Glucose-Capillary: 144 mg/dL — ABNORMAL HIGH (ref 70–99)
Glucose-Capillary: 133 mg/dL — ABNORMAL HIGH (ref 70–99)

## 2014-05-03 MED ORDER — SODIUM CHLORIDE 0.9 % IV BOLUS (SEPSIS): 500.0000 mL | Freq: Once | INTRAVENOUS | Status: DC

## 2014-05-03 MED ORDER — PREDNISONE 10 MG PO TABS: ORAL_TABLET | ORAL | Status: DC

## 2014-05-03 MED ORDER — FUROSEMIDE 20 MG PO TABS
20.0000 mg | ORAL_TABLET | Freq: Two times a day (BID) | ORAL | Status: DC
  Filled 2014-05-03: qty 1

## 2014-05-03 MED ORDER — ALPRAZOLAM 0.5 MG PO TABS
1.0000 mg | ORAL_TABLET | Freq: Four times a day (QID) | ORAL | Status: DC
  Administered 2014-05-03 (×2): 1 mg via ORAL
  Filled 2014-05-03 (×2): qty 2

## 2014-05-03 MED ORDER — POTASSIUM CHLORIDE CRYS ER 20 MEQ PO TBCR: 40.0000 meq | EXTENDED_RELEASE_TABLET | Freq: Three times a day (TID) | ORAL | Status: DC

## 2014-05-03 MED ORDER — LEVOFLOXACIN 750 MG PO TABS: 750.0000 mg | ORAL_TABLET | Freq: Every day | ORAL | Status: DC

## 2014-05-03 NOTE — Clinical Documentation Improvement (Signed)
1. Please clarify respiratory status. Thank you.  Possible Clinical Conditions?  Acute Respiratory Failure Acute on Chronic Respiratory Failure Chronic Respiratory Failure Other Condition Cannot Clinically Determine   Please clarify. Thank you.  Possible Clinical Conditions?   " Respiratory Acidosis " Metabolic Acidosis " Respiratory Alkalosis " Metabolic Alkalosis " Other Condition__________________ " Cannot Clinically Determine   Supporting Information: Admitted w/COPD exacerbation 6/23 RT note:Per MD pt was placed on 55% Venturi mask because SPO2 was 86% on 5 LPM [place]. Pt is now 93% on 55%  Sign and Symptoms:  Shortness of breath/Hypoxia RR 16-22 Bilat. Wheezing ED Rohail Klees: Marked inspiratory and expiratory wheezing and coarse rhonchi  Diagnostics: O2 sats 86% on RA 6/23 ABG pH, Arterial 7.350 - 7.450  7.318 (L)   pCO2 arterial 35.0 - 45.0 mmHg  69.0 (HH)   pO2, Arterial 80.0 - 100.0 mmHg  55.0 (L)   Bicarbonate 20.0 - 24.0 mEq/L  35.5 (H)   TCO2 0 - 100 mmol/L  38   O2 Saturation %  85.0   Acid-Base Excess 0.0 - 2.0 mmol/L  6.0 (H)   Patient temperature  98.0 F   Collection site  IV START   Drawn by  RT   Sample type  ARTERIAL     Treatment   55% Venturi mask  Keep O2 sats >92% Pulse ox. Continuous DuoNeb q4h    Thank You, Harless Littenebora T Williams ,RN Clinical Documentation Specialist:  [phone]  [facility]- Health Information Management

## 2014-05-03 NOTE — Progress Notes (Signed)
[facility] PROGRESS NOTE  Filed Weights   05/01/14 2155 05/02/14 0258 05/03/14 0459  Weight: 108.863 kg (240 lb) 114.8 kg (253 lb 1.4 oz) 110.6 kg (243 lb 13.3 oz)        Intake/Output Summary (Last 24 hours) at 05/03/14 1307 Last data filed at 05/03/14 1016  Gross per 24 hour  Intake   1020 ml  Output   2200 ml  Net  -1180 ml     Assessment/Plan: Acute on chronic respiratory failure due to COPD exacerbation: - Patient presents with with complaints of shortness of breath and cough. Her chest x-ray shows possible bronchial thickening.  - change Solu-Medrol to oral prednisone, DuoNeb, IV ceftriaxone and azithromycin to levaquin, we'll follow sputum cultures urine antigens.  - hold lasix give NS bolus, check a b-met replace K.  Hypertension: - Patient is unable to recall her dose of losartan. At present patient stable would continue to monitor.   Anxiety GERD  - Continue Xanax    Neck pain, chest pain : For neck pain we used lidocaine. For chest pain we will rule out ACS.     Code Status: Full  Disposition: Admitted to inpatient in telemetry unit. Disposition Plan: inpatinet   Consultants:  none  Procedures: ECHO 0.37.0488: Systolic function was normal. The estimated ejection fraction was in the range of 55% to 60%. Wall motion was normal; there were no regional wall motion abnormalities.    Antibiotics:  Rocephin and azithro   HPI/Subjective: No complains wants to go home.  Objective: Filed Vitals:   05/03/14 0459 05/03/14 0832 05/03/14 1129 05/03/14 1135  BP: 122/61     Pulse: 71     Temp: 98 F (36.7 C)     TempSrc: Axillary     Resp: 18     Height:      Weight: 110.6 kg (243 lb 13.3 oz)     SpO2: 92% 95% 95% 93%     Exam:  General: Alert, awake, oriented x3, in no acute distress.  HEENT: No bruits, no goiter.  Heart: Regular rate and rhythm, Lungs: Good air movement, mild wheezing. Abdomen: Soft, nontender, nondistended,  positive bowel sounds.    Data Reviewed: Basic Metabolic Panel:  Recent Labs Lab 05/01/14 2228 05/02/14 0410  NA 141 141  K 3.5* 3.4*  CL 98 97  CO2 31 30  GLUCOSE 127* 210*  BUN 9 10  CREATININE 1.00 0.91  CALCIUM 9.2 8.7   Liver Function Tests:  Recent Labs Lab 05/01/14 2228 05/02/14 0410  AST 14 11  ALT 13 11  ALKPHOS 87 86  BILITOT <0.2* <0.2*  PROT 6.9 6.2  ALBUMIN 3.4* 3.1*   No results found for this basename: LIPASE, AMYLASE,  in the last 168 hours No results found for this basename: AMMONIA,  in the last 168 hours CBC:  Recent Labs Lab 05/01/14 2228 05/02/14 0410  WBC 6.4 5.1  NEUTROABS 3.4  --   HGB 14.4 13.0  HCT 45.9 42.9  MCV 96.0 97.1  PLT 203 189   Cardiac Enzymes:  Recent Labs Lab 05/01/14 2228 05/02/14 0410 05/02/14 0905 05/02/14 1515  TROPONINI <0.30 <0.30 <0.30 <0.30   BNP (last 3 results)  Recent Labs  05/01/14 2228  PROBNP 20.5   CBG:  Recent Labs Lab 05/02/14 1648 05/02/14 2124 05/03/14 0552 05/03/14 1110  GLUCAP 144* 161* 144* 133*    No results found for this or any previous visit (from the past 240 hour(s)).  Studies: Dg Chest 2 View  05/01/2014   CLINICAL DATA:  Cough with shortness of breath. Chest heaviness. Home oxygen.  EXAM: CHEST  2 VIEW  COMPARISON:  12/15/2009  FINDINGS: Borderline heart size without vascular congestion. Central peribronchial thickening with linear infiltration in the right lung base probably representing changes of bronchitis or airways disease. No focal consolidation or airspace disease noted.  IMPRESSION: Peribronchial thickening and linear streaky opacities suggesting bronchitis or airways disease. No focal consolidation.   Electronically Signed   By: Lucienne Capers M.D.   On: 05/01/2014 22:48    Scheduled Meds: . ALPRAZolam  1 mg Oral QID  . antiseptic oral rinse  15 mL Mouth Rinse q12n4p  . azithromycin  500 mg Intravenous Q24H  . budesonide (PULMICORT) nebulizer solution   0.25 mg Nebulization BID  . cefTRIAXone (ROCEPHIN)  IV  1 g Intravenous Q24H  . chlorhexidine  15 mL Mouth Rinse BID  . enoxaparin  55 mg Subcutaneous Q24H  . furosemide  40 mg Oral BID  . guaiFENesin  600 mg Oral BID  . insulin aspart  0-15 Units Subcutaneous TID WC  . ipratropium-albuterol  3 mL Nebulization Q4H  . levothyroxine  150 mcg Oral QAC breakfast  . lidocaine  1 patch Transdermal Q24H  . methylPREDNISolone (SOLU-MEDROL) injection  60 mg Intravenous Q6H  . pantoprazole  40 mg Oral Q1200  . phenazopyridine  100 mg Oral TID WC  . potassium chloride  40 mEq Oral BID  . potassium chloride  40 mEq Oral TID  . sodium chloride  3 mL Intravenous Q12H   Continuous Infusions:    Charlynne Cousins  [facility] Pager [phone] If 8PM-8AM, please contact night-coverage at [email or web address], password [facility] 05/03/2014, 1:07 PM  LOS: 2 days     **Disclaimer: This note may have been dictated with voice recognition software. Similar sounding words can inadvertently be transcribed and this note may contain transcription errors which may not have been corrected upon publication of note.**

## 2014-05-03 NOTE — Discharge Summary (Signed)
Physician Discharge Summary  Gabrielle Richardson SKA:[id number] DOB: Jul 18, 1956 DOA: 05/01/2014  PCP: [name], NP  Admit date: 05/01/2014 Discharge date: 05/03/2014  Time spent:35  minutes  Recommendations for Outpatient Follow-up:  1. Follow up with PCP in 1 week.  Discharge Diagnoses:  Principal Problem:   Acute-on-chronic respiratory failure Active Problems:   COPD exacerbation   Unspecified hypothyroidism   CHF (congestive heart failure)   Oxygen dependent   Anxiety state, unspecified   Chronic pain syndrome   Essential hypertension, benign   Hypercarbia   Discharge Condition: stable  Diet recommendation: low sodium  Filed Weights   05/01/14 2155 05/02/14 0258 05/03/14 0459  Weight: 108.863 kg (240 lb) 114.8 kg (253 lb 1.4 oz) 110.6 kg (243 lb 13.3 oz)    History of present illness:  58 y.o. female with Past medical history of COPD, CHF, hypothyroidism, hypertension, bipolar disorder.  Patient presents with complaints of cough and shortness of breath. She mentions that this has been ongoing since last one week and was worse tonight. She started having yellowish expectoration. She started having shortness of breath even at rest. She hasn't used. Complaints of nausea and episode of vomiting no episode of choking. No chest pain no palpitation. No abdominal pain no diarrhea or constipation. She complains of burning urination.  She complains of bilateral leg swelling.   Hospital Course:  Acute on chronic respiratory failure due to COPD exacerbation:  - Patient presents with with complaints of shortness of breath and cough. Her chest x-ray shows possible bronchial thickening. Started on IV solumedrol,rocephin and azithromycin. - Change Solu-Medrol to oral prednisone, DuoNeb, IV ceftriaxone and azithromycin to levaquin, we'll follow sputum cultures urine antigens.  - held lasix give NS bolus, check a b-met replace K.  - she related she wanted to go home. I have explained to  her the risk and benefits of going home early, like coming back, sudden death and even being intubated bit not limited to these, she relates she understand the risk and will still like to go home.  Hypertension:  - Patient is unable to recall her dose of losartan. At present patient stable would continue to monitor.   Anxiety GERD  - Continue Xanax   Neck pain, chest pain :  For neck pain we used lidocaine. For chest pain we will rule out ACS   Procedures:  CXR  Consultations:  none  Discharge Exam: Filed Vitals:   05/03/14 1310  BP: 128/71  Pulse: 90  Temp: 98.2 F (36.8 C)  Resp: 18    General: See progress note  Discharge Instructions You were cared for by a hospitalist during your hospital stay. If you have any questions about your discharge medications or the care you received while you were in the hospital after you are discharged, you can call the unit and asked to speak with the hospitalist on call if the hospitalist that took care of you is not available. Once you are discharged, your primary care physician will handle any further medical issues. Please note that NO REFILLS for any discharge medications will be authorized once you are discharged, as it is imperative that you return to your primary care physician (or establish a relationship with a primary care physician if you do not have one) for your aftercare needs so that they can reassess your need for medications and monitor your lab values.      Discharge Instructions   Diet - low sodium heart healthy    Complete  by:  As directed      Increase activity slowly    Complete by:  As directed             Medication List         albuterol 108 (90 BASE) MCG/ACT inhaler  Commonly known as:  PROVENTIL HFA;VENTOLIN HFA  Inhale 1 puff into the lungs 4 (four) times daily.     ALPRAZolam 1 MG tablet  Commonly known as:  XANAX  Take 1 mg by mouth 4 (four) times daily.     ARIPiprazole 30 MG tablet  Commonly  known as:  ABILIFY  Take 30 mg by mouth daily.     carisoprodol 350 MG tablet  Commonly known as:  SOMA  Take 350 mg by mouth 3 (three) times daily.     citalopram 20 MG tablet  Commonly known as:  CELEXA  Take 20 mg by mouth daily.     estradiol 1 MG tablet  Commonly known as:  ESTRACE  Take 1 mg by mouth daily.     fluticasone-salmeterol 115-21 MCG/ACT inhaler  Commonly known as:  ADVAIR HFA  Inhale 2 puffs into the lungs 2 (two) times daily.     furosemide 20 MG tablet  Commonly known as:  LASIX  Take 20 mg by mouth 2 (two) times daily.     ipratropium 0.03 % nasal spray  Commonly known as:  ATROVENT  Place 2 sprays into both nostrils 2 (two) times daily as needed for rhinitis.     levofloxacin 750 MG tablet  Commonly known as:  LEVAQUIN  Take 1 tablet (750 mg total) by mouth daily.     levothyroxine 150 MCG tablet  Commonly known as:  SYNTHROID, LEVOTHROID  Take 150 mcg by mouth daily before breakfast.     losartan 100 MG tablet  Commonly known as:  COZAAR  Take 100 mg by mouth daily.     OXYCONTIN 20 mg T12a 12 hr tablet  Generic drug:  OxyCODONE  Take 20 mg by mouth every 12 (twelve) hours. Take 1 tablet as soon as patient wakes up, then takes a second tablet 12 hours later     Oxycodone HCl 10 MG Tabs  Take 10 mg by mouth 2 (two) times daily. Take first tablet 6 hours after oxycontin er 93m then second tablet 6 hours later.     pantoprazole 40 MG tablet  Commonly known as:  PROTONIX  Take 40 mg by mouth daily.     POTASSIMIN PO  Take by mouth 2 (two) times daily.     potassium chloride 10 MEQ tablet  Commonly known as:  K-DUR  Take 10 mEq by mouth 2 (two) times daily.     pramipexole 0.125 MG tablet  Commonly known as:  MIRAPEX  Take 0.125 mg by mouth 3 (three) times daily as needed (restless leg syndrome).     predniSONE 10 MG tablet  Commonly known as:  DELTASONE  Takes 6 tablets for 1 days, then 5 tablets for 1 days, then 4 tablets for 1 days,  then 3 tablets for 1 days, then 2 tabs for 1 days, then 1 tab for 1 days, and then stop.     spironolactone 25 MG tablet  Commonly known as:  ALDACTONE  Take 25 mg by mouth 2 (two) times daily.     tiotropium 18 MCG inhalation capsule  Commonly known as:  SPIRIVA  Place 18 mcg into inhaler and inhale daily.  Allergies  Allergen Reactions  . Ivp Dye [Iodinated Diagnostic Agents]     itching  . Morphine And Related     rash  . Vioxx [Rofecoxib]     itching  . Tape Rash   Follow-up Information   Follow up with COOK, ROBIN S In 1 week. (hospital follow up)    Specialty:  Nurse Practitioner   Contact information:   [address] [place] [address] [phone]        The results of significant diagnostics from this hospitalization (including imaging, microbiology, ancillary and laboratory) are listed below for reference.    Significant Diagnostic Studies: Dg Chest 2 View  05/01/2014   CLINICAL DATA:  Cough with shortness of breath. Chest heaviness. Home oxygen.  EXAM: CHEST  2 VIEW  COMPARISON:  12/15/2009  FINDINGS: Borderline heart size without vascular congestion. Central peribronchial thickening with linear infiltration in the right lung base probably representing changes of bronchitis or airways disease. No focal consolidation or airspace disease noted.  IMPRESSION: Peribronchial thickening and linear streaky opacities suggesting bronchitis or airways disease. No focal consolidation.   Electronically Signed   By: Lucienne Capers M.D.   On: 05/01/2014 22:48    Microbiology: No results found for this or any previous visit (from the past 240 hour(s)).   Labs: Basic Metabolic Panel:  Recent Labs Lab 05/01/14 2228 05/02/14 0410  NA 141 141  K 3.5* 3.4*  CL 98 97  CO2 31 30  GLUCOSE 127* 210*  BUN 9 10  CREATININE 1.00 0.91  CALCIUM 9.2 8.7   Liver Function Tests:  Recent Labs Lab 05/01/14 2228 05/02/14 0410  AST 14 11  ALT  13 11  ALKPHOS 87 86  BILITOT <0.2* <0.2*  PROT 6.9 6.2  ALBUMIN 3.4* 3.1*   No results found for this basename: LIPASE, AMYLASE,  in the last 168 hours No results found for this basename: AMMONIA,  in the last 168 hours CBC:  Recent Labs Lab 05/01/14 2228 05/02/14 0410  WBC 6.4 5.1  NEUTROABS 3.4  --   HGB 14.4 13.0  HCT 45.9 42.9  MCV 96.0 97.1  PLT 203 189   Cardiac Enzymes:  Recent Labs Lab 05/01/14 2228 05/02/14 0410 05/02/14 0905 05/02/14 1515  TROPONINI <0.30 <0.30 <0.30 <0.30   BNP: BNP (last 3 results)  Recent Labs  05/01/14 2228  PROBNP 20.5   CBG:  Recent Labs Lab 05/02/14 1648 05/02/14 2124 05/03/14 0552 05/03/14 1110  GLUCAP 144* 161* 144* 133*       Signed:  FELIZ ORTIZ, ABRAHAM  [facility] 05/03/2014, 1:22 PM

## 2014-05-03 NOTE — Progress Notes (Signed)
SATURATION QUALIFICATIONS: (This note is used to comply with regulatory documentation for home oxygen)  Patient Saturations on Room Air at Rest =  95%  Patient Saturations on Room Air while Ambulating = 91%  Patient Saturations on     N/A  Liters of oxygen while Ambulating = N/A  Please briefly explain why patient needs home oxygen: 

## 2014-05-04 NOTE — ED Provider Notes (Signed)
Medical screening examination/treatment/procedure(s) were conducted as a shared visit with non-physician practitioner(s) and myself.  I personally evaluated the patient during the encounter.   EKG Interpretation   Date/Time:  Monday May 01 2014 22:19:12 EDT Ventricular Rate:  79 PR Interval:  148 QRS Duration: 94 QT Interval:  404 QTC Calculation: 463 R Axis:   -80 Text Interpretation:  Normal sinus rhythm Left axis deviation Cannot rule  out Anterior infarct , age undetermined Abnormal ECG No significant change  since last tracing Confirmed by YAO  MD, DAVID ([id number]) on 05/01/2014  10:27:03 PM      Gabrielle Richardson is a 58 y.o. female hx of COPD on home O2, CHF, smoker here with shortness of breath. Chest pain and shortness of breath since this afternoon, she states that it felt like elephant sitting on her chest. EKG showed no obvious STEMI. Appears tachypneic. + diffuse wheezing. Didn't require bipap. Trop neg x 1. CXR showed no obvious consolidation. BNP nl. Given continuous neb, solumedrol. Will admit for COPD exacerbation    Richardean Canalavid H Yao, MD 05/04/14 [phone]

## 2015-05-19 IMAGING — CR DG CHEST 2V
2 series · 2 of 2 positions shown · non-contrast
Comparison: 12/15/2009

CLINICAL DATA: Cough with shortness of breath. Chest heaviness.
Home oxygen.

EXAM:
CHEST  2 VIEW

[w chest pa]
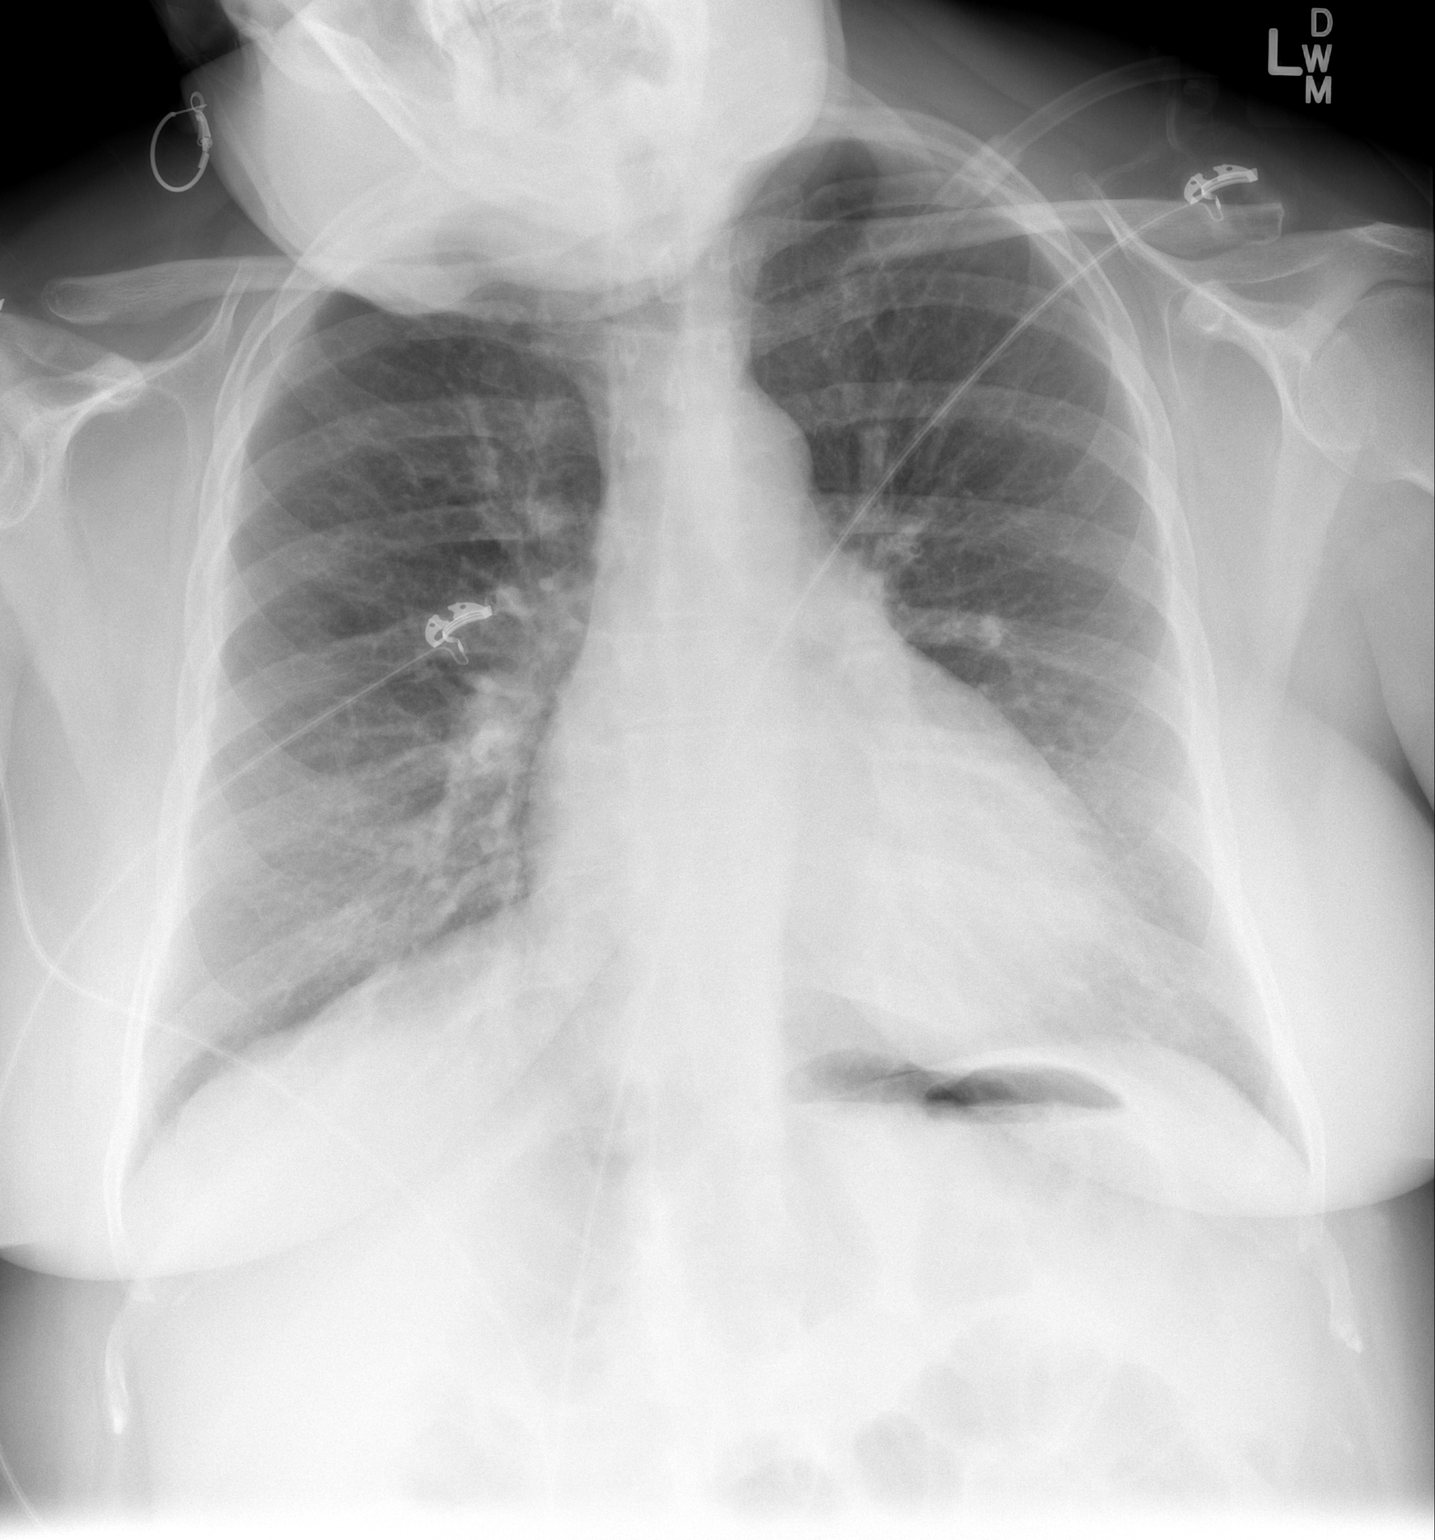

[w chest lat]
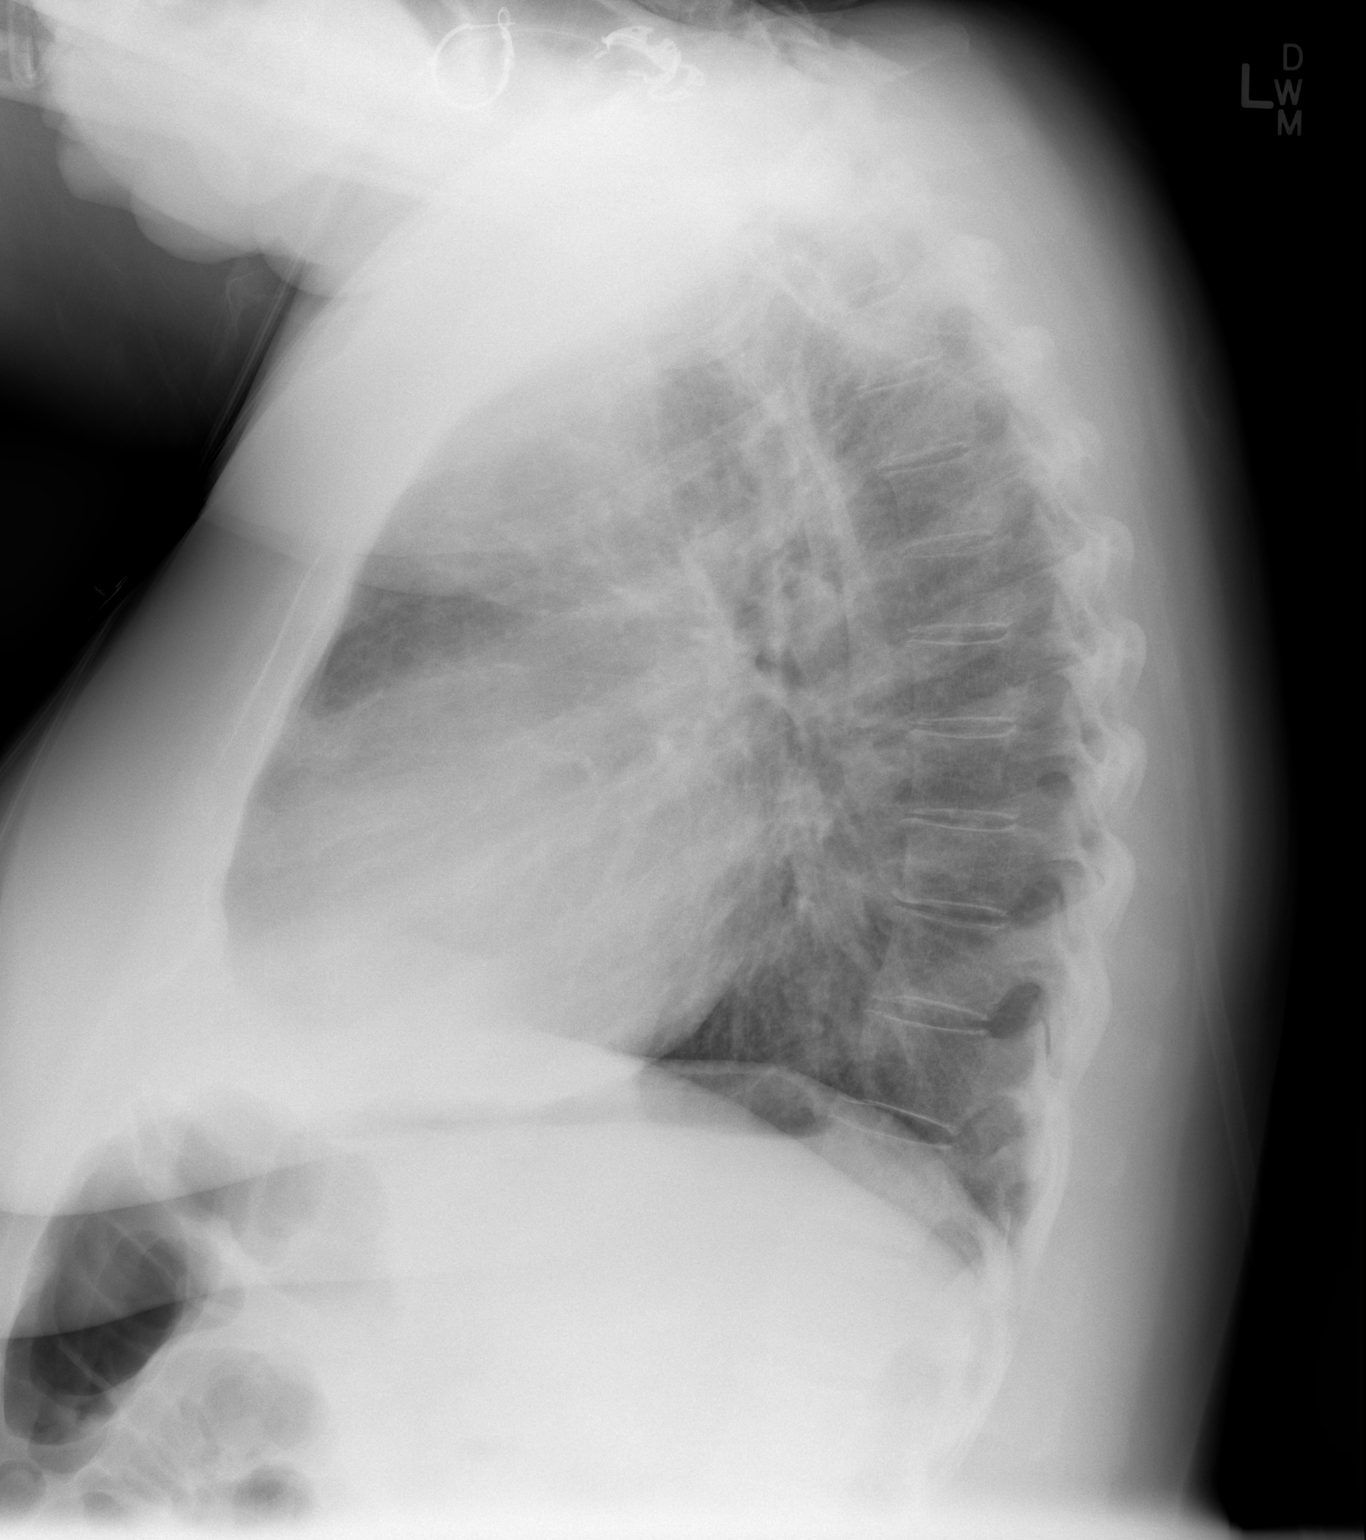

[2 of 2 positions shown; findings below may reference images not displayed]

FINDINGS: Borderline heart size without vascular congestion. Central
peribronchial thickening with linear infiltration in the right lung
base probably representing changes of bronchitis or airways disease.
No focal consolidation or airspace disease noted.
IMPRESSION: Peribronchial thickening and linear streaky opacities suggesting
bronchitis or airways disease. No focal consolidation.

## 2016-10-21 ENCOUNTER — Emergency Department (HOSPITAL_BASED_OUTPATIENT_CLINIC_OR_DEPARTMENT_OTHER)
Admission: EM | Admit: 2016-10-21 | Discharge: 2016-10-21 | Disposition: A | Discharge status: Home / Self Care | Payer: Medicare Other | Attending: Emergency Medicine | Admitting: Emergency Medicine

## 2016-10-21 ENCOUNTER — Emergency Department (HOSPITAL_BASED_OUTPATIENT_CLINIC_OR_DEPARTMENT_OTHER): Payer: Medicare Other

## 2016-10-21 ENCOUNTER — Encounter (HOSPITAL_BASED_OUTPATIENT_CLINIC_OR_DEPARTMENT_OTHER): Payer: Self-pay

## 2016-10-21 DIAGNOSIS — Z79899 Other long term (current) drug therapy: Secondary | ICD-10-CM | POA: Diagnosis not present

## 2016-10-21 DIAGNOSIS — J441 Chronic obstructive pulmonary disease with (acute) exacerbation: Secondary | ICD-10-CM | POA: Diagnosis not present

## 2016-10-21 DIAGNOSIS — F200 Paranoid schizophrenia: Secondary | ICD-10-CM | POA: Insufficient documentation

## 2016-10-21 DIAGNOSIS — L03119 Cellulitis of unspecified part of limb: Secondary | ICD-10-CM

## 2016-10-21 DIAGNOSIS — L03116 Cellulitis of left lower limb: Secondary | ICD-10-CM | POA: Diagnosis not present

## 2016-10-21 DIAGNOSIS — E039 Hypothyroidism, unspecified: Secondary | ICD-10-CM | POA: Insufficient documentation

## 2016-10-21 DIAGNOSIS — I110 Hypertensive heart disease with heart failure: Secondary | ICD-10-CM | POA: Insufficient documentation

## 2016-10-21 DIAGNOSIS — Z87891 Personal history of nicotine dependence: Secondary | ICD-10-CM | POA: Diagnosis not present

## 2016-10-21 DIAGNOSIS — L03115 Cellulitis of right lower limb: Secondary | ICD-10-CM | POA: Diagnosis not present

## 2016-10-21 DIAGNOSIS — M7989 Other specified soft tissue disorders: Secondary | ICD-10-CM | POA: Diagnosis present

## 2016-10-21 DIAGNOSIS — I509 Heart failure, unspecified: Secondary | ICD-10-CM | POA: Diagnosis not present

## 2016-10-21 MED ORDER — CEPHALEXIN 250 MG PO CAPS
500.0000 mg | ORAL_CAPSULE | Freq: Once | ORAL | Status: AC
  Administered 2016-10-21: 500 mg via ORAL

## 2016-10-21 MED ORDER — CEPHALEXIN 250 MG PO CAPS
ORAL_CAPSULE | ORAL | Status: AC
  Filled 2016-10-21: qty 2

## 2016-10-21 MED ORDER — CEPHALEXIN 500 MG PO CAPS: 500.0000 mg | ORAL_CAPSULE | Freq: Three times a day (TID) | ORAL | 0 refills | Status: AC

## 2016-10-21 NOTE — ED Triage Notes (Signed)
C/o bilat LE swelling x "weeks"-worse x 2 weeks-pt states she LWBS [facility] ED today-NAD-presents to triage in w/c

## 2016-10-21 NOTE — ED Notes (Signed)
Patient transported to X-ray 

## 2016-10-21 NOTE — ED Notes (Signed)
Md made aware of lab results from [facility] done earlier today, results printed and handed to MD

## 2016-10-21 NOTE — Discharge Instructions (Signed)
Continue your Lasix as prescribed. Elevate your legs above your heart as much as possible. Take the antibiotic prescribed. Contact Gabrielle Richardson tomorrow morning to arrange to be seen in her office this week. Return if your condition worsens for any reason

## 2016-10-21 NOTE — ED Provider Notes (Addendum)
[facility] Provider Note   CSN: [id number] Arrival date & time: 10/21/16  1639  By signing my name below, I, Arianna Nassar, attest that this documentation has been prepared under the direction and in the presence of Doug SouSam Mae Cianci, MD.  Electronically Signed: Octavia HeirArianna Nassar, ED Scribe. 10/21/16. 6:51 PM.    History   Chief Complaint Chief Complaint  Patient presents with  . Leg Swelling    The history is provided by the patient. No language interpreter was used.   HPI Comments: Gabrielle Richardson is a 60 y.o. female who has a PMhx of CHF, COPD, HTN, thyroid disease, bipolar 1 disorder, presents to the Emergency Department complaining of recurrent, intermittent, moderate bilateral leg pain and swelling that started 2 weeks ago. Pt says her leg pain and swelling became gradually worse 2 days ago. She expresses gaining 5 lbs overnight two days ago after taking her Lasix. She is on 40 mg of Lasix BID.She lost 3 pounds after taking Lasix. She reports having a hx of similar symptoms that started two years ago and she notes having frequent flare-ups. Pt takes oxycodone for her chronic bilateral leg pain prescribed by her PCP. She notes that she recently moved to the area two months [place]. Her PCP is 90 miles away but she will continue to see her current PCP, Dr. Adriana Simasook Pt is on 2.5 L/min [place] of O2 at home at baseline at night.. She is on Plavix 75 mg qd and Abilify 30 mg qd. Pt is a former smoker stating that she quit smoking 3 months ago and does not consume any ETOH. Pt's last tetanus shot was about two years ago. She denies fever or shortness of breath. Presents today as her legs to become reddened over the past 2 days . She presented [facility] earlier today had lab work performed consistent with venous blood gas pH 7.37 , bicarbonate 38.9, blood cultures were performed CBC was remarkable for hemoglobin 10.0 white blood cell count 5.8 platelet count 20 4000 complete metabolic  panel remarkable for bicarbonate 38 otherwise normal with BUN 11 creatinine 1.0 urinalysis normal . Denies dyspnea. Denies orthopnea  PCP: Mordecai MaesOOK, ROBIN S, NP  Past Medical History:  Diagnosis Date  . Bipolar 1 disorder (HCC)   . CHF (congestive heart failure) (HCC)   . COPD (chronic obstructive pulmonary disease) (HCC)   . Hypertension   . Paranoid schizophrenia (HCC)   . Thyroid disease     Patient Active Problem List   Diagnosis Date Noted  . Acute-on-chronic respiratory failure (HCC) 05/03/2014  . COPD exacerbation (HCC) 05/02/2014  . Unspecified hypothyroidism 05/02/2014  . CHF (congestive heart failure) (HCC) 05/02/2014  . Oxygen dependent 05/02/2014  . Anxiety state, unspecified 05/02/2014  . Chronic pain syndrome 05/02/2014  . Essential hypertension, benign 05/02/2014  . Hypercarbia 05/02/2014    Past Surgical History:  Procedure Laterality Date  . ABDOMINAL HYSTERECTOMY    . SHOULDER SURGERY    . TOE AMPUTATION      OB History    No data available       Home Medications    Prior to Admission medications   Medication Sig Start Date End Date Taking? Authorizing Provider  albuterol (PROVENTIL HFA;VENTOLIN HFA) 108 (90 BASE) MCG/ACT inhaler Inhale 1 puff into the lungs 4 (four) times daily.    Historical Provider, MD  ALPRAZolam Prudy Feeler(XANAX) 1 MG tablet Take 1 mg by mouth 4 (four) times daily.     Historical Provider, MD  ARIPiprazole (ABILIFY) 30 MG tablet Take 30 mg by mouth daily.    Historical Provider, MD  citalopram (CELEXA) 20 MG tablet Take 20 mg by mouth daily.    Historical Provider, MD  fluticasone-salmeterol (ADVAIR HFA) 115-21 MCG/ACT inhaler Inhale 2 puffs into the lungs 2 (two) times daily.    Historical Provider, MD  furosemide (LASIX) 20 MG tablet Take 20 mg by mouth 2 (two) times daily.    Historical Provider, MD  ipratropium (ATROVENT) 0.03 % nasal spray Place 2 sprays into both nostrils 2 (two) times daily as needed for rhinitis.    Historical  Provider, MD  levothyroxine (SYNTHROID, LEVOTHROID) 150 MCG tablet Take 150 mcg by mouth daily before breakfast.    Historical Provider, MD  Oxycodone HCl 10 MG TABS Take 10 mg by mouth 2 (two) times daily. Take first tablet 6 hours after oxycontin er 20mg  then second tablet 6 hours later.    Historical Provider, MD  pantoprazole (PROTONIX) 40 MG tablet Take 40 mg by mouth daily.    Historical Provider, MD  Potassium (POTASSIMIN PO) Take by mouth 2 (two) times daily.     Historical Provider, MD  potassium chloride (K-DUR) 10 MEQ tablet Take 10 mEq by mouth 2 (two) times daily.    Historical Provider, MD    Family History No family history on file.  Social History Social History  Substance Use Topics  . Smoking status: Former Smoker    Packs/day: 1.50    Types: Cigarettes  . Smokeless tobacco: Never Used     Comment: quit sept 2017  . Alcohol use No     Allergies   Ivp dye [iodinated diagnostic agents]; Vioxx [rofecoxib]; and Tape   Review of Systems Review of Systems  Constitutional: Negative.   HENT: Negative.   Respiratory: Negative.   Cardiovascular: Positive for leg swelling.  Gastrointestinal: Negative.   Musculoskeletal: Positive for myalgias.       Chronic bilateral leg pain  Skin: Negative.   Neurological: Negative.   Psychiatric/Behavioral: Negative.   All other systems reviewed and are negative.    Physical Exam Updated Vital Signs BP 139/61   Pulse 72   Temp 98.2 F (36.8 C) (Oral)   Resp 13   Ht 5\' 7"  (1.702 m)   Wt 240 lb (108.9 kg)   SpO2 100%   BMI 37.59 kg/m   Physical Exam  Constitutional: No distress.  Chronically ill-appearing  HENT:  Head: Normocephalic and atraumatic.  Eyes: Conjunctivae are normal. Pupils are equal, round, and reactive to light.  Neck: Neck supple. No tracheal deviation present. No thyromegaly present.  Cardiovascular: Normal rate, regular rhythm, normal heart sounds and intact distal pulses.   No murmur  heard. Pulmonary/Chest: Effort normal. No respiratory distress.  Minimal end expiratory wheezes  Abdominal: Soft. Bowel sounds are normal. She exhibits no distension. There is no tenderness.  Obese  Musculoskeletal: Normal range of motion. She exhibits edema. She exhibits no tenderness.  Bilateral lower extremities with skin thickening. 3+ pretibial pitting edema. Lower legs and distal thighs are reddened, not warm, minimally tender. Peripheral pulses intact.  Neurological: She is alert. Coordination normal.  Skin: Skin is warm and dry. Capillary refill takes less than 2 seconds. No rash noted.  Psychiatric: She has a normal mood and affect.  Nursing note and vitals reviewed.    ED Treatments / Results  DIAGNOSTIC STUDIES: Oxygen Saturation is 100% on RA, normal by my interpretation.  COORDINATION OF CARE:  6:50 PM Discussed  treatment plan with pt at bedside and pt agreed to plan.  Labs (all labs ordered are listed, but only abnormal results are displayed) Labs Reviewed - No data to display  EKG  EKG Interpretation  Date/Time:  Tuesday October 21 2016 17:06:57 EST Ventricular Rate:  66 PR Interval:    QRS Duration: 114 QT Interval:  438 QTC Calculation: 456 R Axis:   -59 Text Interpretation:  Sinus rhythm Left anterior fascicular block Nonspecific T abnrm, anterolateral leads No significant change since last tracing Confirmed by Ethelda ChickJACUBOWITZ  MD, Jaysun Wessels [phone]([id number]) on 10/21/2016 6:41:32 PM       Radiology Dg Chest 2 View  Result Date: 10/21/2016 CLINICAL DATA:  Shortness of breath with lower extremity edema EXAM: CHEST  2 VIEW COMPARISON:  August 25, 2016 FINDINGS: There is no edema or consolidation. The heart size and pulmonary vascularity are normal. No adenopathy. No bone lesions. IMPRESSION: No edema or consolidation Electronically Signed   By: Bretta BangWilliam  Woodruff III M.D.   On: 10/21/2016 17:21   Chest x-ray viewed by me Procedures Procedures (including critical care  time)  Medications Ordered in ED Medications - No data to display   Initial Impression / Assessment and Plan / ED Course  I have reviewed the triage vital signs and the nursing notes.  Pertinent labs & imaging results that were available during my care of the patient were reviewed by me and considered in my medical decision making (see chart for details).  Clinical Course    Plan continue Lasix. Prescription Keflex. Follow-up up with robin cook.NP this week. Call tomorrow to schedule appointment. Return if condition worsens for any reason  I suspect 89-90% on room air is patient's baseline. She denies any dyspnea and does not appear to be dyspneic Final Clinical Impressions(s) / ED Diagnoses  #Cellulitis of lower extremities #2 chronic leg edema #3 chronic pain Final diagnoses:  None    New Prescriptions New Prescriptions   No medications on file    I personally performed the services described in this documentation, which was scribed in my presence. The recorded information has been reviewed and considered. Doug SouSam Trudee Chirino, MD 10/21/16 Windell [id number]    Doug SouSam Japneet Staggs, MD 10/21/16 [id number]    Doug SouSam Shanea Karney, MD 10/21/16 [id number]    Doug SouSam Shiheem Corporan, MD 10/21/16 [id number]

## 2016-10-21 NOTE — ED Notes (Signed)
ED Provider at bedside. 

## 2019-04-11 DEATH — deceased
